# Patient Record
Sex: Male | Born: 1950 | Race: White | Hispanic: No | Marital: Married | Smoking: Former smoker
Health system: Southern US, Community
[De-identification: ages and names within clinical notes are randomized; demographics above are authoritative.]

## PROBLEM LIST (undated history)

## (undated) DIAGNOSIS — E785 Hyperlipidemia, unspecified: Secondary | ICD-10-CM

## (undated) DIAGNOSIS — G4730 Sleep apnea, unspecified: Secondary | ICD-10-CM

## (undated) DIAGNOSIS — Z973 Presence of spectacles and contact lenses: Secondary | ICD-10-CM

## (undated) DIAGNOSIS — N183 Chronic kidney disease, stage 3 unspecified: Secondary | ICD-10-CM

## (undated) DIAGNOSIS — I10 Essential (primary) hypertension: Secondary | ICD-10-CM

## (undated) DIAGNOSIS — M1990 Unspecified osteoarthritis, unspecified site: Secondary | ICD-10-CM

## (undated) DIAGNOSIS — C801 Malignant (primary) neoplasm, unspecified: Secondary | ICD-10-CM

## (undated) HISTORY — DX: Hyperlipidemia, unspecified: E78.5

## (undated) HISTORY — DX: Essential (primary) hypertension: I10

## (undated) HISTORY — PX: JOINT REPLACEMENT: SHX530

## (undated) HISTORY — PX: COLONOSCOPY: SHX174

## (undated) HISTORY — PX: APPENDECTOMY: SHX54

---

## 1983-04-18 HISTORY — PX: KNEE ARTHROSCOPY: SUR90

## 1996-04-17 HISTORY — PX: KNEE ARTHROSCOPY: SUR90

## 2005-04-17 HISTORY — PX: SHOULDER SURGERY: SHX246

## 2009-02-16 ENCOUNTER — Ambulatory Visit (HOSPITAL_BASED_OUTPATIENT_CLINIC_OR_DEPARTMENT_OTHER): Admission: RE | Admit: 2009-02-16 | Discharge: 2009-02-17 | Payer: Self-pay | Admitting: Orthopedic Surgery

## 2015-04-20 DIAGNOSIS — H5203 Hypermetropia, bilateral: Secondary | ICD-10-CM | POA: Diagnosis not present

## 2015-04-20 DIAGNOSIS — H43813 Vitreous degeneration, bilateral: Secondary | ICD-10-CM | POA: Diagnosis not present

## 2015-04-20 DIAGNOSIS — H40003 Preglaucoma, unspecified, bilateral: Secondary | ICD-10-CM | POA: Diagnosis not present

## 2015-04-20 DIAGNOSIS — H524 Presbyopia: Secondary | ICD-10-CM | POA: Diagnosis not present

## 2015-04-20 DIAGNOSIS — H43313 Vitreous membranes and strands, bilateral: Secondary | ICD-10-CM | POA: Diagnosis not present

## 2015-04-20 DIAGNOSIS — I10 Essential (primary) hypertension: Secondary | ICD-10-CM | POA: Diagnosis not present

## 2015-04-20 DIAGNOSIS — H52223 Regular astigmatism, bilateral: Secondary | ICD-10-CM | POA: Diagnosis not present

## 2015-05-04 DIAGNOSIS — M159 Polyosteoarthritis, unspecified: Secondary | ICD-10-CM | POA: Diagnosis not present

## 2015-05-04 DIAGNOSIS — Z1389 Encounter for screening for other disorder: Secondary | ICD-10-CM | POA: Diagnosis not present

## 2015-05-04 DIAGNOSIS — I10 Essential (primary) hypertension: Secondary | ICD-10-CM | POA: Diagnosis not present

## 2015-05-04 DIAGNOSIS — Z6839 Body mass index (BMI) 39.0-39.9, adult: Secondary | ICD-10-CM | POA: Diagnosis not present

## 2015-05-19 DIAGNOSIS — Z79899 Other long term (current) drug therapy: Secondary | ICD-10-CM | POA: Diagnosis not present

## 2015-05-19 DIAGNOSIS — Z6841 Body Mass Index (BMI) 40.0 and over, adult: Secondary | ICD-10-CM | POA: Diagnosis not present

## 2015-05-19 DIAGNOSIS — I10 Essential (primary) hypertension: Secondary | ICD-10-CM | POA: Diagnosis not present

## 2015-05-20 DIAGNOSIS — M5136 Other intervertebral disc degeneration, lumbar region: Secondary | ICD-10-CM | POA: Diagnosis not present

## 2015-05-20 DIAGNOSIS — M9903 Segmental and somatic dysfunction of lumbar region: Secondary | ICD-10-CM | POA: Diagnosis not present

## 2015-05-20 DIAGNOSIS — M7552 Bursitis of left shoulder: Secondary | ICD-10-CM | POA: Diagnosis not present

## 2015-05-20 DIAGNOSIS — M9902 Segmental and somatic dysfunction of thoracic region: Secondary | ICD-10-CM | POA: Diagnosis not present

## 2015-05-20 DIAGNOSIS — M9906 Segmental and somatic dysfunction of lower extremity: Secondary | ICD-10-CM | POA: Diagnosis not present

## 2015-05-24 DIAGNOSIS — M9903 Segmental and somatic dysfunction of lumbar region: Secondary | ICD-10-CM | POA: Diagnosis not present

## 2015-05-24 DIAGNOSIS — M9906 Segmental and somatic dysfunction of lower extremity: Secondary | ICD-10-CM | POA: Diagnosis not present

## 2015-05-24 DIAGNOSIS — M7552 Bursitis of left shoulder: Secondary | ICD-10-CM | POA: Diagnosis not present

## 2015-05-24 DIAGNOSIS — M5136 Other intervertebral disc degeneration, lumbar region: Secondary | ICD-10-CM | POA: Diagnosis not present

## 2015-05-24 DIAGNOSIS — M9902 Segmental and somatic dysfunction of thoracic region: Secondary | ICD-10-CM | POA: Diagnosis not present

## 2015-06-04 DIAGNOSIS — G4733 Obstructive sleep apnea (adult) (pediatric): Secondary | ICD-10-CM | POA: Diagnosis not present

## 2015-06-24 DIAGNOSIS — I10 Essential (primary) hypertension: Secondary | ICD-10-CM | POA: Diagnosis not present

## 2015-06-24 DIAGNOSIS — Z79899 Other long term (current) drug therapy: Secondary | ICD-10-CM | POA: Diagnosis not present

## 2015-06-24 DIAGNOSIS — Z6841 Body Mass Index (BMI) 40.0 and over, adult: Secondary | ICD-10-CM | POA: Diagnosis not present

## 2015-06-25 DIAGNOSIS — Z79899 Other long term (current) drug therapy: Secondary | ICD-10-CM | POA: Diagnosis not present

## 2015-07-02 DIAGNOSIS — G4733 Obstructive sleep apnea (adult) (pediatric): Secondary | ICD-10-CM | POA: Diagnosis not present

## 2015-07-21 DIAGNOSIS — G4733 Obstructive sleep apnea (adult) (pediatric): Secondary | ICD-10-CM | POA: Diagnosis not present

## 2015-07-21 DIAGNOSIS — Z1389 Encounter for screening for other disorder: Secondary | ICD-10-CM | POA: Diagnosis not present

## 2015-07-21 DIAGNOSIS — Z6841 Body Mass Index (BMI) 40.0 and over, adult: Secondary | ICD-10-CM | POA: Diagnosis not present

## 2015-07-21 DIAGNOSIS — J0110 Acute frontal sinusitis, unspecified: Secondary | ICD-10-CM | POA: Diagnosis not present

## 2015-08-02 DIAGNOSIS — G4733 Obstructive sleep apnea (adult) (pediatric): Secondary | ICD-10-CM | POA: Diagnosis not present

## 2015-09-01 DIAGNOSIS — G4733 Obstructive sleep apnea (adult) (pediatric): Secondary | ICD-10-CM | POA: Diagnosis not present

## 2015-09-07 DIAGNOSIS — G4733 Obstructive sleep apnea (adult) (pediatric): Secondary | ICD-10-CM | POA: Diagnosis not present

## 2015-10-02 DIAGNOSIS — G4733 Obstructive sleep apnea (adult) (pediatric): Secondary | ICD-10-CM | POA: Diagnosis not present

## 2015-11-01 DIAGNOSIS — G4733 Obstructive sleep apnea (adult) (pediatric): Secondary | ICD-10-CM | POA: Diagnosis not present

## 2015-12-02 DIAGNOSIS — G4733 Obstructive sleep apnea (adult) (pediatric): Secondary | ICD-10-CM | POA: Diagnosis not present

## 2015-12-14 DIAGNOSIS — G4733 Obstructive sleep apnea (adult) (pediatric): Secondary | ICD-10-CM | POA: Diagnosis not present

## 2015-12-29 DIAGNOSIS — N183 Chronic kidney disease, stage 3 (moderate): Secondary | ICD-10-CM | POA: Diagnosis not present

## 2015-12-29 DIAGNOSIS — I10 Essential (primary) hypertension: Secondary | ICD-10-CM | POA: Diagnosis not present

## 2015-12-29 DIAGNOSIS — Z6841 Body Mass Index (BMI) 40.0 and over, adult: Secondary | ICD-10-CM | POA: Diagnosis not present

## 2015-12-29 DIAGNOSIS — Z9181 History of falling: Secondary | ICD-10-CM | POA: Diagnosis not present

## 2016-01-02 DIAGNOSIS — G4733 Obstructive sleep apnea (adult) (pediatric): Secondary | ICD-10-CM | POA: Diagnosis not present

## 2016-01-17 DIAGNOSIS — M25561 Pain in right knee: Secondary | ICD-10-CM | POA: Diagnosis not present

## 2016-01-17 DIAGNOSIS — M25562 Pain in left knee: Secondary | ICD-10-CM | POA: Diagnosis not present

## 2016-01-17 DIAGNOSIS — G4730 Sleep apnea, unspecified: Secondary | ICD-10-CM | POA: Diagnosis not present

## 2016-01-17 DIAGNOSIS — I10 Essential (primary) hypertension: Secondary | ICD-10-CM | POA: Diagnosis not present

## 2016-01-17 DIAGNOSIS — E785 Hyperlipidemia, unspecified: Secondary | ICD-10-CM | POA: Diagnosis not present

## 2016-02-01 DIAGNOSIS — G4733 Obstructive sleep apnea (adult) (pediatric): Secondary | ICD-10-CM | POA: Diagnosis not present

## 2016-02-08 ENCOUNTER — Encounter (INDEPENDENT_AMBULATORY_CARE_PROVIDER_SITE_OTHER): Payer: Self-pay | Admitting: Orthopaedic Surgery

## 2016-02-08 ENCOUNTER — Ambulatory Visit (INDEPENDENT_AMBULATORY_CARE_PROVIDER_SITE_OTHER): Payer: PPO

## 2016-02-08 ENCOUNTER — Ambulatory Visit (INDEPENDENT_AMBULATORY_CARE_PROVIDER_SITE_OTHER): Payer: PPO | Admitting: Orthopaedic Surgery

## 2016-02-08 ENCOUNTER — Ambulatory Visit (INDEPENDENT_AMBULATORY_CARE_PROVIDER_SITE_OTHER): Payer: Self-pay

## 2016-02-08 VITALS — BP 100/72 | HR 66 | Ht 69.0 in | Wt 252.0 lb

## 2016-02-08 DIAGNOSIS — M1711 Unilateral primary osteoarthritis, right knee: Secondary | ICD-10-CM

## 2016-02-08 DIAGNOSIS — M25562 Pain in left knee: Secondary | ICD-10-CM | POA: Diagnosis not present

## 2016-02-08 DIAGNOSIS — M25561 Pain in right knee: Secondary | ICD-10-CM

## 2016-02-08 DIAGNOSIS — M1712 Unilateral primary osteoarthritis, left knee: Secondary | ICD-10-CM | POA: Diagnosis not present

## 2016-02-08 DIAGNOSIS — M170 Bilateral primary osteoarthritis of knee: Secondary | ICD-10-CM | POA: Insufficient documentation

## 2016-02-08 MED ORDER — METHYLPREDNISOLONE ACETATE 40 MG/ML IJ SUSP
40.0000 mg | INTRAMUSCULAR | Status: AC | PRN
  Administered 2016-02-08: 40 mg via INTRA_ARTICULAR

## 2016-02-08 MED ORDER — BUPIVACAINE HCL 0.25 % IJ SOLN
0.6600 mL | INTRAMUSCULAR | Status: AC | PRN
  Administered 2016-02-08: .66 mL via INTRA_ARTICULAR

## 2016-02-08 MED ORDER — LIDOCAINE HCL 1 % IJ SOLN
1.0000 mL | INTRAMUSCULAR | Status: AC | PRN
  Administered 2016-02-08: 1 mL

## 2016-02-08 NOTE — Progress Notes (Signed)
Office Visit Note   Patient: Jesse Ali           Date of Birth: [birth year]           MRN: [id number] Visit Date: 02/08/2016              Requested by: No referring provider defined for this encounter. PCP: Ronita Hipps, MD   Assessment & Plan: Visit Diagnoses:  1. Bilateral primary osteoarthritis of knee   2. Left knee pain, unspecified chronicity   3. Right knee pain, unspecified chronicity     Plan: Bilateral knee injections performed. He will increase the Aleve to 2 by mouth twice a day.  Follow-Up Instructions: We discussed the problems with the Aleve. We discussed activity modification. Return 1 month for follow-up  Orders:  Orders Placed This Encounter  Procedures  . Large Joint Injection/Arthrocentesis  . Large Joint Injection/Arthrocentesis  . XR Knee 1-2 Views Left  . XR Knee 1-2 Views Right   Meds ordered this encounter  Medications  . atorvastatin (LIPITOR) 10 MG tablet  . losartan-hydrochlorothiazide (HYZAAR) 100-25 MG tablet  . aspirin 81 MG chewable tablet    Sig: Chew by mouth daily.  . naproxen sodium (ANAPROX) 220 MG tablet    Sig: Take 220 mg by mouth 2 (two) times daily. 2 aleve tablets daily      Procedures: Large Joint Inj Date/Time: 02/08/2016 11:01 AM Performed by: Marybelle Killings Authorized by: Rodell Perna C   Consent Given by:  Patient Indications:  Pain and joint swelling Location:  Knee Site:  R knee Needle Size:  22 G Needle Length:  1.5 inches Approach:  Anterolateral Ultrasound Guidance: No   Fluoroscopic Guidance: No  no Medications:  1 mL lidocaine 1 %; 40 mg methylPREDNISolone acetate 40 MG/ML; 0.66 mL bupivacaine 0.25 % Patient tolerance:  Patient tolerated the procedure well with no immediate complications Large Joint Inj Date/Time: 02/08/2016 11:01 AM Performed by: Marybelle Killings Authorized by: Rodell Perna C   Consent Given by:  Patient Indications:  Pain and joint swelling Location:  Knee Site:  L knee Needle  Size:  22 G Needle Length:  1.5 inches Approach:  Anterolateral Ultrasound Guidance: No   Fluoroscopic Guidance: No  no Medications:  1 mL lidocaine 1 %; 40 mg methylPREDNISolone acetate 40 MG/ML; 0.66 mL bupivacaine 0.25 % Patient tolerance:  Patient tolerated the procedure well with no immediate complications     Clinical Data: No additional findings.   Subjective: Chief Complaint  Patient presents with  . Left Knee - Pain, Weakness, Edema  . Right Knee - Pain, Weakness    Patient presents with chronic bilateral knee pain, left greater than right. Left knee pain more medial and across knee. Patient complains of pain, swelling and weakness. History of left knee scope in [place] in [place]. Patient has had prior injury to left knee when he was approximately 65 years old.  Right knee pain as well, mostly medial to straight across knee. Injured right knee in a work place accident in 1989. Was carrying a heavy load and twisted knee. History of right knee scope in 1998.    Weakness  Associated symptoms include weakness.    Review of Systems  Neurological: Positive for weakness.     Objective: Vital Signs: BP 100/72 (BP Location: Right Arm)   Pulse 66   Ht 5\' 9"  (1.753 m)   Wt 252 lb (114.3 kg)   BMI 37.21 kg/m   Physical Exam  Constitutional: He is oriented to person, place, and time. He appears well-developed and well-nourished.  HENT:  Head: Normocephalic and atraumatic.  Eyes: EOM are normal. Pupils are equal, round, and reactive to light.  Neck: Normal range of motion. No tracheal deviation present. No thyromegaly present.  Cardiovascular: Normal rate, regular rhythm and normal heart sounds.   Pulmonary/Chest: Effort normal. He has wheezes.  Abdominal: Soft. Bowel sounds are normal.  Neurological: He is alert and oriented to person, place, and time. He has normal reflexes.  Skin: Skin is warm and dry. Capillary refill takes less than 2 seconds.  Psychiatric: He has a  normal mood and affect. His behavior is normal. Judgment and thought content normal.    Ortho Exam patient is 2+ bilateral knee effusion. Normal hip range of motion 40 internal and external rotation symmetrical. No pitting edema dorsalis pedis posterior tibial pulses are normal. Upper summary the range of motion is good. Normal cervical flexion and normal extension no brachial plexus tenderness elbows reach full extension no synovitis of the hands or fingers. No rash or exposed skin.  Knee exam demonstrates stable collateral ligaments crucial ligament exam is normal he lacks the 5 reaching full extension. Gait shows he walks with a stiff knee gait with limited flexion. He has the varus deformity less than 10 of the left knee. Right knee shows less than 5 varus. Bilateral medial joint line tenderness palpable osteophytes lateral compartment is tender patellofemoral crepitus. Quad strength is good.  Specialty Comments:  No specialty comments available.  Imaging: Xr Knee 1-2 Views Left  Result Date: 02/08/2016 Two-view x-rays left knee obtained. Tricompartmental degenerative changes medial marginal osteophytes joint space narrowing bone-on-bone in the medial compartment. Lateral compartment shows marginal osteophytes and patellofemoral degenerative changes. Anterior loose bodies noted on lateral x-ray  Xr Knee 1-2 Views Right  Result Date: 02/08/2016 Standing x-rays right knee AP and lateral demonstrates tricompartmental degenerative changes osteoarthritis. Medial bone-on-bone changes lateral marginal osteophytes patellofemoral degenerative changes. Mild varus deformity. Impression moderate to severe knee osteoarthritis tricompartmental    PMFS History: There are no active problems to display for this patient.  Past Medical History:  Diagnosis Date  . Hyperlipidemia   . Hypertension     History reviewed. No pertinent family history.  Past Surgical History:  Procedure Laterality Date   . KNEE ARTHROSCOPY Right 1998  . KNEE ARTHROSCOPY Left 1985  . SHOULDER SURGERY Right 2007   removal bone spurs   Social History   Occupational History  . retired    Social History Main Topics  . Smoking status: Former Research scientist (life sciences)  . Smokeless tobacco: Never Used     Comment: quit smoking 33 years ago  . Alcohol use No  . Drug use: No  . Sexual activity: Yes    Partners: Female

## 2016-02-08 NOTE — Patient Instructions (Signed)
Take Aleve 2 by mouth twice a day with food. Use ice as needed to your knees .

## 2016-03-03 DIAGNOSIS — G4733 Obstructive sleep apnea (adult) (pediatric): Secondary | ICD-10-CM | POA: Diagnosis not present

## 2016-03-14 ENCOUNTER — Encounter (INDEPENDENT_AMBULATORY_CARE_PROVIDER_SITE_OTHER): Payer: Self-pay | Admitting: Orthopaedic Surgery

## 2016-03-14 ENCOUNTER — Ambulatory Visit (INDEPENDENT_AMBULATORY_CARE_PROVIDER_SITE_OTHER): Payer: PPO | Admitting: Orthopaedic Surgery

## 2016-03-14 VITALS — BP 130/79 | HR 65 | Ht 69.0 in | Wt 248.0 lb

## 2016-03-14 DIAGNOSIS — M170 Bilateral primary osteoarthritis of knee: Secondary | ICD-10-CM

## 2016-03-14 NOTE — Progress Notes (Signed)
Office Visit Note   Patient: Jesse Ali           Date of Birth: [birth year]           MRN: [id number] Visit Date: 03/14/2016              Requested by: Ronita Hipps, MD [address] [place] [address], PCP: Ronita Hipps, MD   Assessment & Plan: Visit Diagnoses:  1. Bilateral primary osteoarthritis of knee     Plan: Patient has been on home therapy program is had intra-articular injections and has tricompartmental degenerative arthritis with progressive varus deformity of both knees worse on the left than right. Patient will not proceed with the total knee arthroplasty starting with the left total knee. Planned procedure discussed was that this up for spinal anesthesia. Home health physical therapy be arranged and then outpatient therapy in [place] . Follow-Up Instructions: No Follow-up on file.   Orders:  No orders of the defined types were placed in this encounter.  No orders of the defined types were placed in this encounter.     Procedures: No procedures performed   Clinical Data: No additional findings.   Subjective: Chief Complaint  Patient presents with  . Left Knee - Follow-up  . Right Knee - Follow-up    Patient returns for one month follow up bilateral knee pain. He had bilateral knee injections on 02/08/2016. He states that the injections only helped for about a day. The pain seems to be worse now. Left knee pain greater than right.    Review of Systems  Constitutional: Negative for chills and diaphoresis.  HENT: Negative for ear discharge, ear pain and nosebleeds.   Eyes: Negative for discharge and visual disturbance.  Respiratory: Negative for cough, choking and shortness of breath.   Cardiovascular: Negative for chest pain and palpitations.       Positive for hypertension  Gastrointestinal: Negative for abdominal distention and abdominal pain.  Endocrine: Negative for cold intolerance and heat intolerance.  Genitourinary: Negative for  flank pain and hematuria.  Skin: Negative for rash and wound.  Neurological: Negative for seizures and speech difficulty.  Hematological: Negative for adenopathy. Does not bruise/bleed easily.  Psychiatric/Behavioral: Negative for agitation and suicidal ideas.     Objective: Vital Signs: BP 130/79   Pulse 65   Ht 5\' 9"  (1.753 m)   Wt 248 lb (112.5 kg)   BMI 36.62 kg/m   Physical Exam  Constitutional: He is oriented to person, place, and time. He appears well-developed and well-nourished.  HENT:  Head: Normocephalic and atraumatic.  Eyes: EOM are normal. Pupils are equal, round, and reactive to light.  Neck: No tracheal deviation present. No thyromegaly present.  Cardiovascular: Normal rate and regular rhythm.   Pulmonary/Chest: Effort normal. He has no wheezes.  Abdominal: Soft. Bowel sounds are normal.  Musculoskeletal:  Normal lower extremity reflexes. No pain with hip range of motion. Standing position bilateral knee varus. Intact distal pulses. Reflexes are 2+.  Neurological: He is alert and oriented to person, place, and time.  Skin: Skin is warm and dry. Capillary refill takes less than 2 seconds.  Psychiatric: He has a normal mood and affect. His behavior is normal. Judgment and thought content normal.    Ortho Exam left knee has a 5 to 120 knee range of motion. Palpable medial osteophytes 2+ synovitis crepitus with knee range of motion. Distal pulses are intact. Opposite right knee has mild varus full extension and flexion to 120.  Specialty Comments:  No specialty comments available.  Imaging: No results found.  Standing x-rays show osteoarthritis is deformity medial compartment 1 mm space both right and left knee. Marginal osteophytes laterally as well as patellofemoral. Subchondral sclerosis and subchondral cyst formation in the medial compartment PMFS History: Patient Active Problem List   Diagnosis Date Noted  . Bilateral primary osteoarthritis of knee  02/08/2016   Past Medical History:  Diagnosis Date  . Hyperlipidemia   . Hypertension     History reviewed. No pertinent family history.  Past Surgical History:  Procedure Laterality Date  . KNEE ARTHROSCOPY Right 1998  . KNEE ARTHROSCOPY Left 1985  . SHOULDER SURGERY Right 2007   removal bone spurs   Social History   Occupational History  . retired    Social History Main Topics  . Smoking status: Former Research scientist (life sciences)  . Smokeless tobacco: Never Used     Comment: quit smoking 33 years ago  . Alcohol use No  . Drug use: No  . Sexual activity: Yes    Partners: Female

## 2016-03-21 ENCOUNTER — Encounter (HOSPITAL_COMMUNITY)
Admission: RE | Admit: 2016-03-21 | Discharge: 2016-03-21 | Disposition: A | Discharge status: Home / Self Care | Payer: PPO | Source: Ambulatory Visit | Attending: Orthopaedic Surgery | Admitting: Orthopaedic Surgery

## 2016-03-21 ENCOUNTER — Ambulatory Visit (HOSPITAL_COMMUNITY)
Admission: RE | Admit: 2016-03-21 | Discharge: 2016-03-21 | Disposition: A | Discharge status: Home / Self Care | Payer: PPO | Source: Ambulatory Visit | Attending: Surgery | Admitting: Surgery

## 2016-03-21 ENCOUNTER — Encounter (HOSPITAL_COMMUNITY): Payer: Self-pay

## 2016-03-21 DIAGNOSIS — Z01812 Encounter for preprocedural laboratory examination: Secondary | ICD-10-CM | POA: Diagnosis not present

## 2016-03-21 DIAGNOSIS — Z01818 Encounter for other preprocedural examination: Secondary | ICD-10-CM | POA: Diagnosis not present

## 2016-03-21 DIAGNOSIS — R001 Bradycardia, unspecified: Secondary | ICD-10-CM | POA: Diagnosis not present

## 2016-03-21 DIAGNOSIS — G4733 Obstructive sleep apnea (adult) (pediatric): Secondary | ICD-10-CM | POA: Diagnosis not present

## 2016-03-21 DIAGNOSIS — Z01810 Encounter for preprocedural cardiovascular examination: Secondary | ICD-10-CM | POA: Insufficient documentation

## 2016-03-21 DIAGNOSIS — D71 Functional disorders of polymorphonuclear neutrophils: Secondary | ICD-10-CM | POA: Diagnosis not present

## 2016-03-21 DIAGNOSIS — I10 Essential (primary) hypertension: Secondary | ICD-10-CM | POA: Insufficient documentation

## 2016-03-21 HISTORY — DX: Chronic kidney disease, stage 3 (moderate): N18.3

## 2016-03-21 HISTORY — DX: Presence of spectacles and contact lenses: Z97.3

## 2016-03-21 HISTORY — DX: Sleep apnea, unspecified: G47.30

## 2016-03-21 HISTORY — DX: Unspecified osteoarthritis, unspecified site: M19.90

## 2016-03-21 HISTORY — DX: Chronic kidney disease, stage 3 unspecified: N18.30

## 2016-03-21 LAB — COMPREHENSIVE METABOLIC PANEL
ALBUMIN: 3.9 g/dL (ref 3.5–5.0)
ALK PHOS: 61 U/L (ref 38–126)
ALT: 24 U/L (ref 17–63)
AST: 22 U/L (ref 15–41)
Anion gap: 6 (ref 5–15)
BUN: 25 mg/dL — AB (ref 6–20)
CALCIUM: 9.5 mg/dL (ref 8.9–10.3)
CO2: 28 mmol/L (ref 22–32)
CREATININE: 1.6 mg/dL — AB (ref 0.61–1.24)
Chloride: 103 mmol/L (ref 101–111)
GFR calc Af Amer: 50 mL/min — ABNORMAL LOW (ref >60)
GFR calc non Af Amer: 43 mL/min — ABNORMAL LOW (ref >60)
GLUCOSE: 98 mg/dL (ref 65–99)
Potassium: 3.9 mmol/L (ref 3.5–5.1)
SODIUM: 137 mmol/L (ref 135–145)
Total Bilirubin: 0.7 mg/dL (ref 0.3–1.2)
Total Protein: 7.3 g/dL (ref 6.5–8.1)

## 2016-03-21 LAB — URINALYSIS, ROUTINE W REFLEX MICROSCOPIC
Bilirubin Urine: NEGATIVE
Glucose, UA: NEGATIVE mg/dL
Hgb urine dipstick: NEGATIVE
Ketones, ur: NEGATIVE mg/dL
Leukocytes, UA: NEGATIVE
Nitrite: NEGATIVE
Protein, ur: NEGATIVE mg/dL
Specific Gravity, Urine: 1.015 (ref 1.005–1.030)
pH: 6 (ref 5.0–8.0)

## 2016-03-21 LAB — SURGICAL PCR SCREEN
MRSA, PCR: NEGATIVE
Staphylococcus aureus: NEGATIVE

## 2016-03-21 LAB — CBC
HCT: 41.5 % (ref 39.0–52.0)
Hemoglobin: 14.2 g/dL (ref 13.0–17.0)
MCH: 31.8 pg (ref 26.0–34.0)
MCHC: 34.2 g/dL (ref 30.0–36.0)
MCV: 92.8 fL (ref 78.0–100.0)
Platelets: 216 K/uL (ref 150–400)
RBC: 4.47 MIL/uL (ref 4.22–5.81)
RDW: 13.2 % (ref 11.5–15.5)
WBC: 9.4 K/uL (ref 4.0–10.5)

## 2016-03-21 NOTE — Pre-Procedure Instructions (Signed)
[facility]  03/21/2016      [place], [place] [address] Phone: [phone] Fax: [phone]    Your procedure is scheduled on Wednesday, December 13.  Report to [facility] Admitting at 10:30 A.M.               Your surgery or procedure is scheduled for 12:30 PM   Call this number if you have problems the morning of surgery: [phone]                For any other questions, please call [phone], Monday - Friday 8 AM - 4 PM.   Remember:  Do not eat food or drink liquids after midnight Tuesday, December 12.  Take these medicines the morning of surgery with A SIP OF WATER: None                               1 Week prior to surgery STOP taking Aspirin,  Aspirin Products (Goody Powder, Excedrin Migraine), Ibuprofen (Advil), Naproxen (Aleve), Vitamins and Herbal Products (ie Fish Oil)                     [facility]- Preparing For Surgery  Before surgery, you can play an important role. Because skin is not sterile, your skin needs to be as free of germs as possible. You can reduce the number of germs on your skin by washing with CHG (chlorahexidine gluconate) Soap before surgery.  CHG is an antiseptic cleaner which kills germs and bonds with the skin to continue killing germs even after washing.  Please do not use if you have an allergy to CHG or antibacterial soaps. If your skin becomes reddened/irritated stop using the CHG.  Do not shave (including legs and underarms) for at least 48 hours prior to first CHG shower. It is OK to shave your face.  Please follow these instructions carefully.   1. Shower the NIGHT BEFORE SURGERY and the MORNING OF SURGERY with CHG.   2. If you chose to wash your hair, wash your hair first as usual with your normal shampoo.  3. After you shampoo, rinse your hair and body thoroughly to remove the shampoo.  4. Use CHG as you would any other liquid soap. You  can apply CHG directly to the skin and wash gently with a scrungie or a clean washcloth.   5. Apply the CHG Soap to your body ONLY FROM THE NECK DOWN.  Do not use on open wounds or open sores. Avoid contact with your eyes, ears, mouth and genitals (private parts). Wash genitals (private parts) with your normal soap.  6. Wash thoroughly, paying special attention to the area where your surgery will be performed.  7. Thoroughly rinse your body with warm water from the neck down.  8. DO NOT shower/wash with your normal soap after using and rinsing off the CHG Soap.  9. Pat yourself dry with a CLEAN TOWEL.   10. Wear CLEAN PAJAMAS   11. Place CLEAN SHEETS on your bed the night of your first shower and DO NOT SLEEP WITH PETS.  Day of Surgery: Do not apply any deodorants/lotions. Please wear clean clothes to the hospital/surgery center.     Do not wear jewelry, make-up or nail polish.  Do not wear lotions, powders, or  perfumes, or deodorant.  Men may shave face and neck.  Do not bring valuables to the hospital.  [facility] is not responsible for any belongings or valuables.  Contacts, dentures or bridgework may not be worn into surgery.  Leave your suitcase in the car.  After surgery it may be brought to your room.  For patients admitted to the hospital, discharge time will be determined by your treatment team.  Special instructions:    Please read over the following fact sheets that you were given:  Patient Instructions for Mupirocin Application, Coughing and Deep Breathing Pain Booklet

## 2016-03-21 NOTE — Progress Notes (Signed)
PCP - Dr. Kennith Maes Cardiologist - denies  EKG - 03/21/16 CXR - 03/21/16  Echo/stress test/cardiac cath - denies  Patient denies chest pain and shortness of breath at PAT appointment.

## 2016-03-22 NOTE — Progress Notes (Addendum)
Anesthesia Chart Review:  Pt is a 65 year old male scheduled for L total knee arthroplasty on 03/29/2016 with Rodell Perna, MD.   - PCP is Kennith Maes, MD in [place].   PMH includes:  HTN, hyperlipidemia, CKD (stage 3), OSA. Former smoker. BMI 38  Medications include: ASA, lipitor, losartan-hctz.   Preoperative labs reviewed.  Cr 1.6, BUN 25. I spoke with Dr. Helene Kelp on 03/24/16.  Prior Cr was 1.12 and BUN was 17 on 01/05/16.  She believes he is still ok for surgery but will attempt to get pt into her office to recheck labs, tune-up meds prior to surgery.   CXR 03/21/16: Prior granulomatous disease. No acute abnormality seen.  EKG 03/21/16: Sinus bradycardia (58 bpm) with sinus arrhythmia. Minimal voltage criteria for LVH, may be normal variant.   Willeen Cass, FNP-BC [facility]/Anesthesiology Phone: [phone] 03/24/2016 4:06 PM  Addendum:  Pt saw Dr. Helene Kelp 03/27/16 and recheck Cr was 1.4. HCTZ was stopped. Dr. Helene Kelp feels pt can proceed with surgery.  If no changes, I anticipate pt can proceed with surgery as scheduled.   Willeen Cass, FNP-BC [facility] Short Stay Surgical Center/Anesthesiology Phone: [phone] 03/27/2016 4:26 PM

## 2016-03-23 ENCOUNTER — Telehealth (INDEPENDENT_AMBULATORY_CARE_PROVIDER_SITE_OTHER): Payer: Self-pay | Admitting: Orthopedic Surgery

## 2016-03-23 NOTE — Telephone Encounter (Signed)
I do not think he will need any blood. If any was actually needed it would be a day or 2 after surgery and likely only one unit.  80 % chance do not need blood. thanks

## 2016-03-23 NOTE — Telephone Encounter (Signed)
Linda from the blood bank called and left a message regarding Mr. Dey. He has a left TKA scheduled for 03/29/2016.  She states on the message that patient had a + antibody screen. They would normally cross match 2 units, do you feel that more blood is needed?

## 2016-03-24 ENCOUNTER — Encounter (HOSPITAL_COMMUNITY): Payer: Self-pay

## 2016-03-24 NOTE — Telephone Encounter (Signed)
I called and advised Blood Bank. They are going to cross match the usual 2 units.

## 2016-03-27 DIAGNOSIS — I10 Essential (primary) hypertension: Secondary | ICD-10-CM | POA: Diagnosis not present

## 2016-03-28 MED ORDER — DEXTROSE 5 % IV SOLN
3.0000 g | INTRAVENOUS | Status: AC
  Administered 2016-03-29: 3 g via INTRAVENOUS
  Filled 2016-03-28: qty 3000

## 2016-03-29 ENCOUNTER — Inpatient Hospital Stay (HOSPITAL_COMMUNITY): Payer: PPO | Admitting: Emergency Medicine

## 2016-03-29 ENCOUNTER — Encounter (HOSPITAL_COMMUNITY): Payer: Self-pay | Admitting: Surgery

## 2016-03-29 ENCOUNTER — Inpatient Hospital Stay (HOSPITAL_COMMUNITY)
Admission: RE | Admit: 2016-03-29 | Discharge: 2016-03-31 | DRG: 470 | Disposition: A | Discharge status: Home / Self Care | Payer: PPO | Source: Ambulatory Visit | Attending: Orthopaedic Surgery | Admitting: Orthopaedic Surgery

## 2016-03-29 ENCOUNTER — Inpatient Hospital Stay (HOSPITAL_COMMUNITY): Payer: PPO | Admitting: Anesthesiology

## 2016-03-29 ENCOUNTER — Inpatient Hospital Stay (HOSPITAL_COMMUNITY): Payer: PPO

## 2016-03-29 ENCOUNTER — Encounter (HOSPITAL_COMMUNITY)
Admission: RE | Disposition: A | Discharge status: Home / Self Care | Payer: Self-pay | Source: Ambulatory Visit | Attending: Orthopaedic Surgery

## 2016-03-29 DIAGNOSIS — Z87891 Personal history of nicotine dependence: Secondary | ICD-10-CM

## 2016-03-29 DIAGNOSIS — I129 Hypertensive chronic kidney disease with stage 1 through stage 4 chronic kidney disease, or unspecified chronic kidney disease: Secondary | ICD-10-CM | POA: Diagnosis not present

## 2016-03-29 DIAGNOSIS — Z96652 Presence of left artificial knee joint: Secondary | ICD-10-CM | POA: Diagnosis not present

## 2016-03-29 DIAGNOSIS — Z7982 Long term (current) use of aspirin: Secondary | ICD-10-CM | POA: Diagnosis not present

## 2016-03-29 DIAGNOSIS — E785 Hyperlipidemia, unspecified: Secondary | ICD-10-CM | POA: Diagnosis not present

## 2016-03-29 DIAGNOSIS — G4730 Sleep apnea, unspecified: Secondary | ICD-10-CM | POA: Diagnosis present

## 2016-03-29 DIAGNOSIS — Z09 Encounter for follow-up examination after completed treatment for conditions other than malignant neoplasm: Secondary | ICD-10-CM

## 2016-03-29 DIAGNOSIS — M25662 Stiffness of left knee, not elsewhere classified: Secondary | ICD-10-CM

## 2016-03-29 DIAGNOSIS — Z471 Aftercare following joint replacement surgery: Secondary | ICD-10-CM | POA: Diagnosis not present

## 2016-03-29 DIAGNOSIS — N183 Chronic kidney disease, stage 3 (moderate): Secondary | ICD-10-CM | POA: Diagnosis not present

## 2016-03-29 DIAGNOSIS — Z79899 Other long term (current) drug therapy: Secondary | ICD-10-CM

## 2016-03-29 DIAGNOSIS — R262 Difficulty in walking, not elsewhere classified: Secondary | ICD-10-CM

## 2016-03-29 DIAGNOSIS — M170 Bilateral primary osteoarthritis of knee: Principal | ICD-10-CM | POA: Diagnosis present

## 2016-03-29 DIAGNOSIS — M19049 Primary osteoarthritis, unspecified hand: Secondary | ICD-10-CM | POA: Diagnosis present

## 2016-03-29 DIAGNOSIS — M1712 Unilateral primary osteoarthritis, left knee: Secondary | ICD-10-CM | POA: Diagnosis not present

## 2016-03-29 DIAGNOSIS — G8918 Other acute postprocedural pain: Secondary | ICD-10-CM | POA: Diagnosis not present

## 2016-03-29 HISTORY — PX: TOTAL KNEE ARTHROPLASTY: SHX125

## 2016-03-29 LAB — APTT: APTT: 28 s (ref 24–36)

## 2016-03-29 LAB — PROTIME-INR
INR: 1.07
Prothrombin Time: 13.9 seconds (ref 11.4–15.2)

## 2016-03-29 LAB — TYPE AND SCREEN
ABO/RH(D): A NEG
ANTIBODY SCREEN: POSITIVE

## 2016-03-29 SURGERY — ARTHROPLASTY, KNEE, TOTAL
Anesthesia: Spinal | Site: Knee | Laterality: Left

## 2016-03-29 MED ORDER — SODIUM CHLORIDE 0.9 % IR SOLN
Status: DC | PRN
  Administered 2016-03-29: 3000 mL

## 2016-03-29 MED ORDER — METHOCARBAMOL 500 MG PO TABS
500.0000 mg | ORAL_TABLET | Freq: Four times a day (QID) | ORAL | Status: DC | PRN
  Administered 2016-03-29 – 2016-03-31 (×4): 500 mg via ORAL
  Filled 2016-03-29 (×3): qty 1

## 2016-03-29 MED ORDER — MENTHOL 3 MG MT LOZG: 1.0000 | LOZENGE | OROMUCOSAL | Status: DC | PRN

## 2016-03-29 MED ORDER — PROPOFOL 500 MG/50ML IV EMUL
INTRAVENOUS | Status: DC | PRN
  Administered 2016-03-29: 100 ug/kg/min via INTRAVENOUS

## 2016-03-29 MED ORDER — CHLORHEXIDINE GLUCONATE 4 % EX LIQD: 60.0000 mL | Freq: Once | CUTANEOUS | Status: DC

## 2016-03-29 MED ORDER — BUPIVACAINE HCL (PF) 0.25 % IJ SOLN
INTRAMUSCULAR | Status: AC
  Filled 2016-03-29: qty 30

## 2016-03-29 MED ORDER — LIDOCAINE 2% (20 MG/ML) 5 ML SYRINGE
INTRAMUSCULAR | Status: AC
  Filled 2016-03-29: qty 5

## 2016-03-29 MED ORDER — ACETAMINOPHEN 650 MG RE SUPP
650.0000 mg | Freq: Four times a day (QID) | RECTAL | Status: DC | PRN
Start: 2016-03-29 — End: 2016-03-31

## 2016-03-29 MED ORDER — FENTANYL CITRATE (PF) 100 MCG/2ML IJ SOLN
INTRAMUSCULAR | Status: AC
  Filled 2016-03-29: qty 2

## 2016-03-29 MED ORDER — FENTANYL CITRATE (PF) 100 MCG/2ML IJ SOLN
INTRAMUSCULAR | Status: AC
  Administered 2016-03-29: 100 ug
  Filled 2016-03-29: qty 2

## 2016-03-29 MED ORDER — PHENOL 1.4 % MT LIQD: 1.0000 | OROMUCOSAL | Status: DC | PRN

## 2016-03-29 MED ORDER — MIDAZOLAM HCL 2 MG/2ML IJ SOLN
INTRAMUSCULAR | Status: AC
  Filled 2016-03-29: qty 2

## 2016-03-29 MED ORDER — FENTANYL CITRATE (PF) 100 MCG/2ML IJ SOLN
INTRAMUSCULAR | Status: DC | PRN
  Administered 2016-03-29: 50 ug via INTRAVENOUS

## 2016-03-29 MED ORDER — HYDROMORPHONE HCL 1 MG/ML IJ SOLN
0.5000 mg | INTRAMUSCULAR | Status: DC | PRN
  Administered 2016-03-29: 1 mg via INTRAVENOUS

## 2016-03-29 MED ORDER — HYDROMORPHONE HCL 1 MG/ML IJ SOLN
INTRAMUSCULAR | Status: AC
  Filled 2016-03-29: qty 1

## 2016-03-29 MED ORDER — OXYCODONE HCL 5 MG PO TABS
5.0000 mg | ORAL_TABLET | ORAL | Status: DC | PRN
  Administered 2016-03-29 – 2016-03-30 (×6): 10 mg via ORAL
  Administered 2016-03-30: 5 mg via ORAL
  Administered 2016-03-31 (×2): 10 mg via ORAL
  Filled 2016-03-29 (×8): qty 2

## 2016-03-29 MED ORDER — ONDANSETRON HCL 4 MG PO TABS: 4.0000 mg | ORAL_TABLET | Freq: Four times a day (QID) | ORAL | Status: DC | PRN

## 2016-03-29 MED ORDER — DEXTROSE 5 % IV SOLN
INTRAVENOUS | Status: DC | PRN
  Administered 2016-03-29: 30 ug/min via INTRAVENOUS

## 2016-03-29 MED ORDER — HYDROMORPHONE HCL 2 MG/ML IJ SOLN: 0.5000 mg | INTRAMUSCULAR | Status: DC | PRN

## 2016-03-29 MED ORDER — PROPOFOL 10 MG/ML IV BOLUS
INTRAVENOUS | Status: DC | PRN
  Administered 2016-03-29 (×2): 10 mg via INTRAVENOUS

## 2016-03-29 MED ORDER — HYDROCHLOROTHIAZIDE 25 MG PO TABS
25.0000 mg | ORAL_TABLET | Freq: Every day | ORAL | Status: DC
  Administered 2016-03-29 – 2016-03-31 (×3): 25 mg via ORAL
  Filled 2016-03-29 (×3): qty 1

## 2016-03-29 MED ORDER — LIDOCAINE 2% (20 MG/ML) 5 ML SYRINGE
INTRAMUSCULAR | Status: DC | PRN
  Administered 2016-03-29: 60 mg via INTRAVENOUS

## 2016-03-29 MED ORDER — METHOCARBAMOL 1000 MG/10ML IJ SOLN
500.0000 mg | Freq: Four times a day (QID) | INTRAVENOUS | Status: DC | PRN
  Filled 2016-03-29: qty 5

## 2016-03-29 MED ORDER — SODIUM CHLORIDE 0.9 % IV SOLN
INTRAVENOUS | Status: DC
  Administered 2016-03-29: via INTRAVENOUS

## 2016-03-29 MED ORDER — BUPIVACAINE HCL (PF) 0.25 % IJ SOLN
INTRAMUSCULAR | Status: DC | PRN
  Administered 2016-03-29: 30 mL

## 2016-03-29 MED ORDER — MIDAZOLAM HCL 2 MG/2ML IJ SOLN
INTRAMUSCULAR | Status: AC
  Administered 2016-03-29: 2 mg via INTRAVENOUS
  Filled 2016-03-29: qty 2

## 2016-03-29 MED ORDER — ASPIRIN EC 325 MG PO TBEC
325.0000 mg | DELAYED_RELEASE_TABLET | Freq: Every day | ORAL | Status: DC
  Administered 2016-03-30 – 2016-03-31 (×2): 325 mg via ORAL
  Filled 2016-03-29 (×2): qty 1

## 2016-03-29 MED ORDER — ACETAMINOPHEN 325 MG PO TABS
650.0000 mg | ORAL_TABLET | Freq: Four times a day (QID) | ORAL | Status: DC | PRN
  Administered 2016-03-29 – 2016-03-31 (×3): 650 mg via ORAL
  Filled 2016-03-29 (×3): qty 2

## 2016-03-29 MED ORDER — LACTATED RINGERS IV SOLN
INTRAVENOUS | Status: DC
  Administered 2016-03-29: 11:00:00 via INTRAVENOUS

## 2016-03-29 MED ORDER — LOSARTAN POTASSIUM 50 MG PO TABS
100.0000 mg | ORAL_TABLET | Freq: Every day | ORAL | Status: DC
  Administered 2016-03-29 – 2016-03-31 (×3): 100 mg via ORAL
  Filled 2016-03-29 (×3): qty 2

## 2016-03-29 MED ORDER — PROPOFOL 10 MG/ML IV BOLUS
INTRAVENOUS | Status: AC
  Filled 2016-03-29: qty 20

## 2016-03-29 MED ORDER — ONDANSETRON HCL 4 MG/2ML IJ SOLN: 4.0000 mg | Freq: Four times a day (QID) | INTRAMUSCULAR | Status: DC | PRN

## 2016-03-29 MED ORDER — BUPIVACAINE LIPOSOME 1.3 % IJ SUSP
20.0000 mL | INTRAMUSCULAR | Status: AC
  Administered 2016-03-29: 20 mL
  Filled 2016-03-29: qty 20

## 2016-03-29 MED ORDER — HYDROMORPHONE HCL 1 MG/ML IJ SOLN
0.2500 mg | INTRAMUSCULAR | Status: DC | PRN
  Administered 2016-03-29 (×4): 0.5 mg via INTRAVENOUS

## 2016-03-29 MED ORDER — MEPERIDINE HCL 25 MG/ML IJ SOLN: 6.2500 mg | INTRAMUSCULAR | Status: DC | PRN

## 2016-03-29 MED ORDER — OXYCODONE HCL 5 MG PO TABS
ORAL_TABLET | ORAL | Status: AC
  Administered 2016-03-29: 10 mg via ORAL
  Filled 2016-03-29: qty 2

## 2016-03-29 MED ORDER — LOSARTAN POTASSIUM-HCTZ 100-25 MG PO TABS: 1.0000 | ORAL_TABLET | Freq: Every day | ORAL | Status: DC

## 2016-03-29 MED ORDER — CEFAZOLIN IN D5W 1 GM/50ML IV SOLN
1.0000 g | Freq: Three times a day (TID) | INTRAVENOUS | Status: AC
  Administered 2016-03-29 – 2016-03-30 (×2): 1 g via INTRAVENOUS
  Filled 2016-03-29 (×2): qty 50

## 2016-03-29 MED ORDER — HYDROMORPHONE HCL 2 MG/ML IJ SOLN
INTRAMUSCULAR | Status: AC
  Filled 2016-03-29: qty 1

## 2016-03-29 MED ORDER — ATORVASTATIN CALCIUM 10 MG PO TABS
10.0000 mg | ORAL_TABLET | Freq: Every day | ORAL | Status: DC
  Administered 2016-03-30 – 2016-03-31 (×2): 10 mg via ORAL
  Filled 2016-03-29 (×2): qty 1

## 2016-03-29 MED ORDER — MIDAZOLAM HCL 2 MG/2ML IJ SOLN
0.5000 mg | Freq: Once | INTRAMUSCULAR | Status: AC
  Administered 2016-03-29: 2 mg via INTRAVENOUS

## 2016-03-29 MED ORDER — METOCLOPRAMIDE HCL 5 MG PO TABS: 5.0000 mg | ORAL_TABLET | Freq: Three times a day (TID) | ORAL | Status: DC | PRN

## 2016-03-29 MED ORDER — 0.9 % SODIUM CHLORIDE (POUR BTL) OPTIME
TOPICAL | Status: DC | PRN
  Administered 2016-03-29: 1000 mL

## 2016-03-29 MED ORDER — METOCLOPRAMIDE HCL 5 MG/ML IJ SOLN: 5.0000 mg | Freq: Three times a day (TID) | INTRAMUSCULAR | Status: DC | PRN

## 2016-03-29 MED ORDER — METHOCARBAMOL 500 MG PO TABS
ORAL_TABLET | ORAL | Status: AC
  Administered 2016-03-29: 500 mg via ORAL
  Filled 2016-03-29: qty 1

## 2016-03-29 MED ORDER — MIDAZOLAM HCL 2 MG/2ML IJ SOLN
INTRAMUSCULAR | Status: AC
  Administered 2016-03-29: 2 mg
  Filled 2016-03-29: qty 2

## 2016-03-29 MED ORDER — ONDANSETRON HCL 4 MG/2ML IJ SOLN
4.0000 mg | Freq: Once | INTRAMUSCULAR | Status: DC | PRN
Start: 2016-03-29 — End: 2016-03-29

## 2016-03-29 SURGICAL SUPPLY — 70 items
BANDAGE ACE 4X5 VEL STRL LF (GAUZE/BANDAGES/DRESSINGS) ×3 IMPLANT
BANDAGE ESMARK 6X9 LF (GAUZE/BANDAGES/DRESSINGS) ×1 IMPLANT
BENZOIN TINCTURE PRP APPL 2/3 (GAUZE/BANDAGES/DRESSINGS) ×3 IMPLANT
BLADE SAGITTAL 25.0X1.19X90 (BLADE) IMPLANT
BLADE SAGITTAL 25.0X1.19X90MM (BLADE)
BLADE SAGITTAL 25.0X1.27X90 (BLADE) ×2 IMPLANT
BLADE SAGITTAL 25.0X1.27X90MM (BLADE) ×1
BLADE SAW SGTL 13X75X1.27 (BLADE) ×3 IMPLANT
BLADE SURG 10 STRL SS (BLADE) ×3 IMPLANT
BNDG ELASTIC 6X10 VLCR STRL LF (GAUZE/BANDAGES/DRESSINGS) ×3 IMPLANT
BNDG ESMARK 6X9 LF (GAUZE/BANDAGES/DRESSINGS) ×3
BOWL SMART MIX CTS (DISPOSABLE) ×3 IMPLANT
CAPT KNEE TOTAL 3 ATTUNE ×3 IMPLANT
CEMENT HV SMART SET (Cement) ×6 IMPLANT
CLOSURE WOUND 1/2 X4 (GAUZE/BANDAGES/DRESSINGS) ×2
COVER SURGICAL LIGHT HANDLE (MISCELLANEOUS) ×3 IMPLANT
CUFF TOURNIQUET SINGLE 34IN LL (TOURNIQUET CUFF) ×3 IMPLANT
CUFF TOURNIQUET SINGLE 44IN (TOURNIQUET CUFF) IMPLANT
DECANTER SPIKE VIAL GLASS SM (MISCELLANEOUS) ×3 IMPLANT
DRAPE ORTHO SPLIT 77X108 STRL (DRAPES) ×4
DRAPE SURG ORHT 6 SPLT 77X108 (DRAPES) ×2 IMPLANT
DRAPE U-SHAPE 47X51 STRL (DRAPES) ×3 IMPLANT
DRSG PAD ABDOMINAL 8X10 ST (GAUZE/BANDAGES/DRESSINGS) ×6 IMPLANT
DURAPREP 26ML APPLICATOR (WOUND CARE) ×3 IMPLANT
ELECT REM PT RETURN 9FT ADLT (ELECTROSURGICAL) ×3
ELECTRODE REM PT RTRN 9FT ADLT (ELECTROSURGICAL) ×1 IMPLANT
EVACUATOR 1/8 PVC DRAIN (DRAIN) IMPLANT
FACESHIELD WRAPAROUND (MASK) ×6 IMPLANT
GAUZE SPONGE 4X4 12PLY STRL (GAUZE/BANDAGES/DRESSINGS) ×6 IMPLANT
GAUZE XEROFORM 5X9 LF (GAUZE/BANDAGES/DRESSINGS) ×3 IMPLANT
GLOVE BIOGEL PI IND STRL 8 (GLOVE) ×2 IMPLANT
GLOVE BIOGEL PI INDICATOR 8 (GLOVE) ×4
GLOVE ORTHO TXT STRL SZ7.5 (GLOVE) ×6 IMPLANT
GOWN STRL REUS W/ TWL LRG LVL3 (GOWN DISPOSABLE) ×1 IMPLANT
GOWN STRL REUS W/ TWL XL LVL3 (GOWN DISPOSABLE) ×1 IMPLANT
GOWN STRL REUS W/TWL 2XL LVL3 (GOWN DISPOSABLE) ×3 IMPLANT
GOWN STRL REUS W/TWL LRG LVL3 (GOWN DISPOSABLE) ×2
GOWN STRL REUS W/TWL XL LVL3 (GOWN DISPOSABLE) ×2
HANDPIECE INTERPULSE COAX TIP (DISPOSABLE) ×2
IMMOBILIZER KNEE 22 (SOFTGOODS) ×3 IMPLANT
IMMOBILIZER KNEE 22 UNIV (SOFTGOODS) ×3 IMPLANT
KIT BASIN OR (CUSTOM PROCEDURE TRAY) ×3 IMPLANT
KIT ROOM TURNOVER OR (KITS) ×3 IMPLANT
MANIFOLD NEPTUNE II (INSTRUMENTS) ×3 IMPLANT
MARKER SKIN DUAL TIP RULER LAB (MISCELLANEOUS) ×3 IMPLANT
NEEDLE HYPO 25GX1X1/2 BEV (NEEDLE) ×3 IMPLANT
NS IRRIG 1000ML POUR BTL (IV SOLUTION) ×3 IMPLANT
PACK TOTAL JOINT (CUSTOM PROCEDURE TRAY) ×3 IMPLANT
PAD ARMBOARD 7.5X6 YLW CONV (MISCELLANEOUS) ×6 IMPLANT
PAD CAST 4YDX4 CTTN HI CHSV (CAST SUPPLIES) ×1 IMPLANT
PADDING CAST COTTON 4X4 STRL (CAST SUPPLIES) ×2
PADDING CAST COTTON 6X4 STRL (CAST SUPPLIES) ×3 IMPLANT
SET HNDPC FAN SPRY TIP SCT (DISPOSABLE) ×1 IMPLANT
STAPLER VISISTAT 35W (STAPLE) ×3 IMPLANT
STRIP CLOSURE SKIN 1/2X4 (GAUZE/BANDAGES/DRESSINGS) ×4 IMPLANT
SUCTION FRAZIER HANDLE 10FR (MISCELLANEOUS) ×2
SUCTION TUBE FRAZIER 10FR DISP (MISCELLANEOUS) ×1 IMPLANT
SUT VIC AB 0 CT1 27 (SUTURE) ×4
SUT VIC AB 0 CT1 27XBRD ANBCTR (SUTURE) ×2 IMPLANT
SUT VIC AB 1 CTX 36 (SUTURE) ×4
SUT VIC AB 1 CTX36XBRD ANBCTR (SUTURE) ×2 IMPLANT
SUT VIC AB 2-0 CT1 27 (SUTURE) ×4
SUT VIC AB 2-0 CT1 TAPERPNT 27 (SUTURE) ×2 IMPLANT
SUT VIC AB 3-0 X1 27 (SUTURE) ×3 IMPLANT
SYR 50ML SLIP (SYRINGE) ×3 IMPLANT
SYR CONTROL 10ML LL (SYRINGE) ×3 IMPLANT
TOWEL OR 17X24 6PK STRL BLUE (TOWEL DISPOSABLE) ×3 IMPLANT
TOWEL OR 17X26 10 PK STRL BLUE (TOWEL DISPOSABLE) ×3 IMPLANT
TRAY CATH 16FR W/PLASTIC CATH (SET/KITS/TRAYS/PACK) IMPLANT
YANKAUER SUCT BULB TIP NO VENT (SUCTIONS) ×3 IMPLANT

## 2016-03-29 NOTE — Progress Notes (Signed)
Orthopedic Tech Progress Note Patient Details:  Jesse Ali 1951/04/16 [id number]  CPM Left Knee CPM Left Knee: On Left Knee Flexion (Degrees): 90 Left Knee Extension (Degrees): 0 Additional Comments: trapeze bar patient helper   Hildred Priest 03/29/2016, 5:27 PM Viewed order from doctor's order list

## 2016-03-29 NOTE — Anesthesia Preprocedure Evaluation (Signed)
Anesthesia Evaluation  Patient identified by MRN, date of birth, ID band Patient awake    Reviewed: Allergy & Precautions  Airway Mallampati: II  TM Distance: >3 FB Neck ROM: Full    Dental   Pulmonary sleep apnea , former smoker,    Pulmonary exam normal        Cardiovascular hypertension, Pt. on medications Normal cardiovascular exam     Neuro/Psych    GI/Hepatic   Endo/Other    Renal/GU Renal InsufficiencyRenal disease     Musculoskeletal   Abdominal   Peds  Hematology   Anesthesia Other Findings   Reproductive/Obstetrics                             Anesthesia Physical Anesthesia Plan  ASA: II  Anesthesia Plan: Spinal and MAC   Post-op Pain Management:  Regional for Post-op pain   Induction: Intravenous  Airway Management Planned: Simple Face Mask  Additional Equipment:   Intra-op Plan:   Post-operative Plan:   Informed Consent: I have reviewed the patients History and Physical, chart, labs and discussed the procedure including the risks, benefits and alternatives for the proposed anesthesia with the patient or authorized representative who has indicated his/her understanding and acceptance.     Plan Discussed with: CRNA and Surgeon  Anesthesia Plan Comments:         Anesthesia Quick Evaluation

## 2016-03-29 NOTE — Anesthesia Postprocedure Evaluation (Signed)
Anesthesia Post Note  Patient: Jesse Ali  Procedure(s) Performed: Procedure(s) (LRB): LEFT TOTAL KNEE ARTHROPLASTY (Left)  Patient location during evaluation: PACU Anesthesia Type: Spinal Level of consciousness: oriented and awake and alert Pain management: pain level controlled Vital Signs Assessment: post-procedure vital signs reviewed and stable Respiratory status: spontaneous breathing, respiratory function stable and patient connected to nasal cannula oxygen Cardiovascular status: blood pressure returned to baseline and stable Postop Assessment: no headache and no backache Anesthetic complications: no    Last Vitals:  Vitals:   03/29/16 1250 03/29/16 1532  BP:  128/87  Pulse: (!) 54 (!) 59  Resp: 15 15  Temp:  36.5 C    Last Pain:  Vitals:   03/29/16 1002  TempSrc: Oral                 Fenix Rorke DAVID

## 2016-03-29 NOTE — Transfer of Care (Signed)
Immediate Anesthesia Transfer of Care Note  Patient: Jesse Ali  Procedure(s) Performed: Procedure(s): LEFT TOTAL KNEE ARTHROPLASTY (Left)  Patient Location: PACU  Anesthesia Type:GA combined with regional for post-op pain  Level of Consciousness: awake, alert , oriented and patient cooperative  Airway & Oxygen Therapy: Patient Spontanous Breathing and Patient connected to nasal cannula oxygen  Post-op Assessment: Report given to RN, Post -op Vital signs reviewed and stable and Patient moving all extremities  Post vital signs: Reviewed and stable  Last Vitals:  Vitals:   03/29/16 1250 03/29/16 1532  BP:    Pulse: (!) 54   Resp: 15   Temp:  (P) 36.5 C    Last Pain:  Vitals:   03/29/16 1002  TempSrc: Oral      Patients Stated Pain Goal: 3 ([id number])  Complications: No apparent anesthesia complications

## 2016-03-29 NOTE — H&P (Signed)
TOTAL KNEE ADMISSION H&P  Patient is being admitted for left total knee arthroplasty.  Subjective:  Chief Complaint:left knee pain.  HPI: Jesse Ali, 65 y.o. male, has a history of pain and functional disability in the left knee due to arthritis and has failed non-surgical conservative treatments for greater than 12 weeks to includeNSAID's and/or analgesics, corticosteriod injections, use of assistive devices, weight reduction as appropriate and activity modification.  Onset of symptoms was gradual, starting 10 years ago with gradually worsening course since that time.knee(s).  Patient currently rates pain in the right knee(s) at 10 out of 10 with activity. Patient has night pain, worsening of pain with activity and weight bearing, pain with passive range of motion, crepitus and joint swelling.  Patient has evidence of subchondral cysts, subchondral sclerosis, periarticular osteophytes and joint space narrowing by imaging studies.  There is no active infection.  Patient Active Problem List   Diagnosis Date Noted  . Bilateral primary osteoarthritis of knee 02/08/2016   Past Medical History:  Diagnosis Date  . Arthritis    fingers  . CKD (chronic kidney disease), stage III   . Hyperlipidemia   . Hypertension   . Sleep apnea    wears CPAP   . Wears glasses   . Wears glasses     Past Surgical History:  Procedure Laterality Date  . APPENDECTOMY    . COLONOSCOPY    . KNEE ARTHROSCOPY Right 1998  . KNEE ARTHROSCOPY Left 1985  . SHOULDER SURGERY Right 2007   removal bone spurs    Prescriptions Prior to Admission  Medication Sig Dispense Refill Last Dose  . aspirin EC 81 MG tablet Take 81 mg by mouth daily at 3 pm.   Past Month at Unknown time  . atorvastatin (LIPITOR) 10 MG tablet Take 10 mg by mouth daily at 3 pm. 1600   03/28/2016 at Unknown time  . losartan-hydrochlorothiazide (HYZAAR) 100-25 MG tablet Take 1 tablet by mouth daily at 3 pm.    03/28/2016 at Unknown time  .  naproxen sodium (ANAPROX) 220 MG tablet Take 440 mg by mouth daily at 3 pm.    Past Month at Unknown time   Allergies  Allergen Reactions  . No Known Allergies     Social History  Substance Use Topics  . Smoking status: Former Research scientist (life sciences)  . Smokeless tobacco: Never Used     Comment: quit smoking 33 years ago  . Alcohol use No    History reviewed. No pertinent family history.   Review of Systems  Constitutional: Negative.   HENT: Negative.   Respiratory: Negative.   Cardiovascular: Negative.   Musculoskeletal: Positive for joint pain.  Skin: Negative.   Neurological: Negative.   Psychiatric/Behavioral: Negative.     Objective:  Physical Exam  Constitutional: He is oriented to person, place, and time. No distress.  HENT:  Head: Normocephalic and atraumatic.  Eyes: EOM are normal. Pupils are equal, round, and reactive to light.  Neck: Normal range of motion.  Respiratory: No respiratory distress.  GI: He exhibits no distension.  Musculoskeletal: He exhibits tenderness.  Neurological: He is alert and oriented to person, place, and time.  Skin: Skin is warm and dry.  Psychiatric: He has a normal mood and affect.    Vital signs in last 24 hours: Temp:  [98 F (36.7 C)] 98 F (36.7 C) (12/13 1002) Pulse Rate:  [58] 58 (12/13 1002) Resp:  [18] 18 (12/13 1002) BP: (148)/(63) 148/63 (12/13 1002) SpO2:  [99 %]  99 % (12/13 1002) Weight:  [257 lb (116.6 kg)] 257 lb (116.6 kg) (12/13 1002)  Labs:   Estimated body mass index is 37.95 kg/m as calculated from the following:   Height as of 03/21/16: 5\' 9"  (1.753 m).   Weight as of this encounter: 257 lb (116.6 kg).   Imaging Review Plain radiographs demonstrate moderate degenerative joint disease of the left knee(s). The overall alignment ismild varus. The bone quality appears to be good for age and reported activity level.  Assessment/Plan:  End stage arthritis, left knee   The patient history, physical examination,  clinical judgment of the provider and imaging studies are consistent with end stage degenerative joint disease of the left knee(s) and total knee arthroplasty is deemed medically necessary. The treatment options including medical management, injection therapy arthroscopy and arthroplasty were discussed at length. The risks and benefits of total knee arthroplasty were presented and reviewed. The risks due to aseptic loosening, infection, stiffness, patella tracking problems, thromboembolic complications and other imponderables were discussed. The patient acknowledged the explanation, agreed to proceed with the plan and consent was signed. Patient is being admitted for inpatient treatment for surgery, pain control, PT, OT, prophylactic antibiotics, VTE prophylaxis, progressive ambulation and ADL's and discharge planning. The patient is planning to be discharged home with home health services

## 2016-03-29 NOTE — Op Note (Signed)
Preop diagnosis: Left knee primary osteoarthritis  Postop diagnosis: Same  Procedure: Left cemented total knee arthroplasty.  Surgeon: Rodell Perna M.D.  Assistant: Benjiman Core PA-C medically necessary and present for the entire procedure  Anesthesia is spinal plus abductor block plus Marcaine and expiratory L.  Tourniquet time: Less than 1 hour 350    Implants    ATTUNE Depuy #5 femur #7 tibia 5 mm spacer rotating platform 38 mm patella  Procedure: After standard prepping and draping preoperative Ancef prophylaxis timeout procedure with proximal thigh tourniquet extremity sheets drapes sterile skin marker and Betadine Steri-Drape were applied. Levels wrapped with an Esmarch tourniquet was inflated timeout procedure had been completed. Midline incision was made medial parapatellar incision patella was everted cut 10 mm off. Intramedullary hole drilled in the femur starting in the middle of the notch and sizing's were consistent with a #5. Anterior posterior chamfer cuts were made box cut was made. Proximal femur cut was made and the trial sizer showed 5 given nice fit full extension. He'll preparation complete day cruciate ligament resection. Posterior spurs were taken off the medial femoral condyle all meniscal remnants were resected with a scalpel. Chamfer cuts were then made on the femur box cut was made pulse lavage trial sizing's. Vacuum mixing of the cement cementing the tibia femur placement of the permanent patellar 5 mm spacer and then 38 mm patella 3 peg. Ligaments had been drilled femur was impacted down flush. Lavage followed by standard layered closure after hemostasis with the tourniquet deflation after 15 minutes when the cement was hard. Pulse lavage been used hemostasis standard layer closure and septic her skin closure expert was injected prior to closure.

## 2016-03-29 NOTE — Anesthesia Procedure Notes (Signed)
Spinal  Patient location during procedure: OR Start time: 03/29/2016 1:00 PM End time: 03/29/2016 1:03 PM Staffing Anesthesiologist: Lillia Abed Performed: anesthesiologist  Preanesthetic Checklist Completed: patient identified, surgical consent, pre-op evaluation, timeout performed, IV checked, risks and benefits discussed and monitors and equipment checked Spinal Block Patient position: sitting Prep: DuraPrep Patient monitoring: heart rate, cardiac monitor, continuous pulse ox and blood pressure Approach: right paramedian Location: L3-4 Injection technique: single-shot Needle Needle type: Pencan  Needle gauge: 24 G Needle length: 9 cm Needle insertion depth: 9 cm

## 2016-03-29 NOTE — Anesthesia Procedure Notes (Addendum)
Anesthesia Regional Block:  Adductor canal block  Pre-Anesthetic Checklist: ,, timeout performed, Correct Patient, Correct Site, Correct Laterality, Correct Procedure, Correct Position, site marked, Risks and benefits discussed,  Surgical consent,  Pre-op evaluation,  At surgeon's request and post-op pain management  Laterality: Left  Prep: chloraprep       Needles:  Injection technique: Single-shot  Needle Type: Echogenic Stimulator Needle     Needle Length: 9cm 9 cm Needle Gauge: 21 and 21 G    Additional Needles:  Procedures: ultrasound guided (picture in chart) Adductor canal block Narrative:  Start time: 03/29/2016 12:35 PM End time: 03/29/2016 12:45 PM Injection made incrementally with aspirations every 5 mL.  Performed by: Personally  Anesthesiologist: Lillia Abed  Additional Notes: Monitors applied. Patient sedated. Sterile prep and drape,hand hygiene and sterile gloves were used. Relevant anatomy identified.Needle position confirmed.Local anesthetic injected incrementally after negative aspiration. Local anesthetic spread visualized around nerve(s). Vascular puncture avoided. No complications. Image printed for medical record.The patient tolerated the procedure well.    Lillia Abed MD

## 2016-03-30 ENCOUNTER — Encounter (HOSPITAL_COMMUNITY): Payer: Self-pay | Admitting: Orthopaedic Surgery

## 2016-03-30 LAB — BASIC METABOLIC PANEL
Anion gap: 7 (ref 5–15)
BUN: 23 mg/dL — ABNORMAL HIGH (ref 6–20)
CALCIUM: 8.3 mg/dL — AB (ref 8.9–10.3)
CO2: 25 mmol/L (ref 22–32)
CREATININE: 1.59 mg/dL — AB (ref 0.61–1.24)
Chloride: 105 mmol/L (ref 101–111)
GFR calc non Af Amer: 44 mL/min — ABNORMAL LOW (ref >60)
GFR, EST AFRICAN AMERICAN: 51 mL/min — AB (ref >60)
GLUCOSE: 110 mg/dL — AB (ref 65–99)
Potassium: 3.9 mmol/L (ref 3.5–5.1)
Sodium: 137 mmol/L (ref 135–145)

## 2016-03-30 LAB — CBC
HEMATOCRIT: 31.1 % — AB (ref 39.0–52.0)
Hemoglobin: 10.3 g/dL — ABNORMAL LOW (ref 13.0–17.0)
MCH: 31.5 pg (ref 26.0–34.0)
MCHC: 33.1 g/dL (ref 30.0–36.0)
MCV: 95.1 fL (ref 78.0–100.0)
Platelets: 165 10*3/uL (ref 150–400)
RBC: 3.27 MIL/uL — ABNORMAL LOW (ref 4.22–5.81)
RDW: 13.4 % (ref 11.5–15.5)
WBC: 11.1 10*3/uL — ABNORMAL HIGH (ref 4.0–10.5)

## 2016-03-30 NOTE — Evaluation (Signed)
Physical Therapy Evaluation Patient Details Name: Jesse Ali MRN: [id number] DOB: 05-21-1950 Today's Date: 03/30/2016   History of Present Illness  65 yo male admitted on 03/29/16 for L TKA. PMH significant for OA, CKD stage 3, and HTN.  Clinical Impression  Pt is POD 1 and moving well with therapy. Pt is anxious about getting home this afternoon, but DC is possible for tomorrow. Prior to admission, pt was completely independent with all ADLs and IADLS including working outdoors and indoors daily. Pt will benefit from HHPT upon DC in order to maximize functional outcomes. Pt will benefit from continuing to be seen acutely in order to address the below deficits before discharge.     Follow Up Recommendations Home health PT    Equipment Recommendations  None recommended by PT    Recommendations for Other Services       Precautions / Restrictions Precautions Precautions: Knee Precaution Booklet Issued: Yes (comment) Precaution Comments: Handout given and reviewed now pillow under knee Required Braces or Orthoses: Knee Immobilizer - Left Knee Immobilizer - Left: On except when in CPM Restrictions Weight Bearing Restrictions: Yes LLE Weight Bearing: Weight bearing as tolerated      Mobility  Bed Mobility               General bed mobility comments: Pt OOB when PT arrives  Transfers Overall transfer level: Needs assistance Equipment used: Rolling walker (2 wheeled) Transfers: Sit to/from Stand Sit to Stand: Min guard         General transfer comment: Min gaurd for safety from recliner  Ambulation/Gait Ambulation/Gait assistance: Min guard Ambulation Distance (Feet): 150 Feet Assistive device: Rolling walker (2 wheeled) Gait Pattern/deviations: Step-to pattern;Decreased weight shift to left;Antalgic Gait velocity: decreased Gait velocity interpretation: Below normal speed for age/gender General Gait Details: Mild antalgic gait with RW. Cues for heel strike on  LLE  Stairs Stairs: Yes Stairs assistance: Min guard Stair Management: No rails;With walker;Backwards;Step to pattern Number of Stairs: 2 General stair comments: Min gaurd for safety and to stablize RW  Wheelchair Mobility    Modified Rankin (Stroke Patients Only)       Balance Overall balance assessment: Needs assistance Sitting-balance support: No upper extremity supported;Feet supported Sitting balance-Leahy Scale: Fair Sitting balance - Comments: Sitting edge of recliner, no back support   Standing balance support: No upper extremity supported Standing balance-Leahy Scale: Fair Standing balance comment: standing at sink without using RW                             Pertinent Vitals/Pain Pain Assessment: 0-10 Pain Score: 5  Pain Location: left knee Pain Descriptors / Indicators: Sharp;Stabbing Pain Intervention(s): Monitored during session;Premedicated before session;Ice applied;Repositioned    Home Living Family/patient expects to be discharged to:: Private residence Living Arrangements: Spouse/significant other Available Help at Discharge: Family;Available PRN/intermittently Type of Home: House Home Access: Stairs to enter Entrance Stairs-Rails: None Entrance Stairs-Number of Steps: 3 Home Layout: One level Home Equipment: Walker - 2 wheels;Shower seat;Hand held shower head      Prior Function Level of Independence: Independent         Comments: completely independent and working around the house     [employer]   Dominant Hand: Right    Extremity/Trunk Assessment   Upper Extremity Assessment Upper Extremity Assessment: Defer to OT evaluation    Lower Extremity Assessment Lower Extremity Assessment: LLE deficits/detail LLE Deficits / Details: Pt with normal post  op pain and weakness. At leas 3/5 ankle, and 2/5 knee and hip per gross functional asessment.     Cervical / Trunk Assessment Cervical / Trunk Assessment: Normal   Communication   Communication: No difficulties  Cognition Arousal/Alertness: Awake/alert Behavior During Therapy: WFL for tasks assessed/performed Overall Cognitive Status: Within Functional Limits for tasks assessed                      General Comments      Exercises Total Joint Exercises Ankle Circles/Pumps: AROM;Left;20 reps;Supine Quad Sets: PROM;Left;10 reps;Supine Heel Slides: AROM;Left;10 reps;Supine Goniometric ROM: 5-65   Assessment/Plan    PT Assessment Patient needs continued PT services  PT Problem List Decreased strength;Decreased range of motion;Decreased activity tolerance;Decreased balance;Decreased mobility;Decreased knowledge of use of DME;Pain          PT Treatment Interventions DME instruction;Gait training;Stair training;Functional mobility training;Therapeutic activities;Therapeutic exercise;Balance training;Patient/family education    PT Goals (Current goals can be found in the Care Plan section)  Acute Rehab PT Goals Patient Stated Goal: to get home today PT Goal Formulation: With patient Time For Goal Achievement: 04/06/16 Potential to Achieve Goals: Good    Frequency 7X/week   Barriers to discharge        Co-evaluation               End of Session Equipment Utilized During Treatment: Gait belt Activity Tolerance: Patient tolerated treatment well Patient left: in chair;with call bell/phone within reach;with family/visitor present Nurse Communication: Mobility status         Time: 1055-1120 PT Time Calculation (min) (ACUTE ONLY): 25 min   Charges:   PT Evaluation $PT Eval Low Complexity: 1 Procedure PT Treatments $Gait Training: 8-22 mins   PT G Codes:        Scheryl Marten PT, DPT  [phone]  03/30/2016, 12:39 PM

## 2016-03-30 NOTE — Progress Notes (Signed)
Physical Therapy Treatment Patient Details Name: Jesse Ali MRN: [id number] DOB: Jan 29, 1951 Today's Date: 03/30/2016    History of Present Illness 65 yo male admitted on 03/29/16 for L TKA. PMH significant for OA, CKD stage 3, and HTN.    PT Comments    Pt presents with decreased cadence and is received up and walking with is wife prior to this visit. Performed gait training with cues for proper step length and heel strike on LLE in order to improve sequencing. Initiated supine exercises and pt requires assistance for SLR and SAQ with cues for improving quad control during exercises. Pt will require an additional therapy visit and review of stairs prior to DC   Follow Up Recommendations  Home health PT     Equipment Recommendations  None recommended by PT    Recommendations for Other Services       Precautions / Restrictions Precautions Precautions: Knee Precaution Booklet Issued: Yes (comment) Precaution Comments: Handout given and reviewed now pillow under knee Required Braces or Orthoses: Knee Immobilizer - Left Knee Immobilizer - Left: On except when in CPM Restrictions Weight Bearing Restrictions: Yes LLE Weight Bearing: Weight bearing as tolerated    Mobility  Bed Mobility               General bed mobility comments: Pt OOB when PT arrives  Transfers Overall transfer level: Needs assistance Equipment used: Rolling walker (2 wheeled) Transfers: Sit to/from Stand Sit to Stand: Supervision         General transfer comment: Supervision for safety  Ambulation/Gait Ambulation/Gait assistance: Min guard Ambulation Distance (Feet): 75 Feet Assistive device: Rolling walker (2 wheeled) Gait Pattern/deviations: Step-to pattern;Decreased weight shift to left;Antalgic Gait velocity: decreased Gait velocity interpretation: Below normal speed for age/gender General Gait Details: Mild antalgic gait with RW. Cues for heel strike on LLE   Stairs Stairs: Yes    Stair Management: No rails;With walker;Backwards;Step to pattern Number of Stairs: 2 General stair comments: Min gaurd for safety and to stablize RW  Wheelchair Mobility    Modified Rankin (Stroke Patients Only)       Balance Overall balance assessment: Needs assistance Sitting-balance support: No upper extremity supported;Feet supported Sitting balance-Leahy Scale: Fair Sitting balance - Comments: Sitting edge of recliner, no back support   Standing balance support: No upper extremity supported Standing balance-Leahy Scale: Fair Standing balance comment: standing at sink without using RW                    Cognition Arousal/Alertness: Awake/alert Behavior During Therapy: WFL for tasks assessed/performed Overall Cognitive Status: Within Functional Limits for tasks assessed                      Exercises Total Joint Exercises Ankle Circles/Pumps: AROM;Left;20 reps;Supine Quad Sets: AROM;Left;10 reps;Supine Short Arc Quad: AROM;Left;10 reps;Supine Heel Slides: Left;10 reps;Supine;AAROM Hip ABduction/ADduction: AAROM;Left;10 reps;Supine Straight Leg Raises: AAROM;Left;10 reps;Sidelying Goniometric ROM: 5-65    General Comments        Pertinent Vitals/Pain Pain Assessment: 0-10 Pain Score: 4  Pain Location: left knee Pain Descriptors / Indicators: Sharp;Stabbing Pain Intervention(s): Monitored during session;Premedicated before session    [place] expects to be discharged to:: Private residence Living Arrangements: Spouse/significant other Available Help at Discharge: Family;Available PRN/intermittently Type of Home: House Home Access: Stairs to enter Entrance Stairs-Rails: None Home Layout: One level Home Equipment: Environmental consultant - 2 wheels;Shower seat;Hand held shower head      Prior Function Level of  Independence: Independent      Comments: completely independent and working around the house   PT Goals (current goals can now  be found in the care plan section) Acute Rehab PT Goals Patient Stated Goal: to get home today PT Goal Formulation: With patient Time For Goal Achievement: 04/06/16 Potential to Achieve Goals: Good Progress towards PT goals: Progressing toward goals    Frequency    7X/week      PT Plan Current plan remains appropriate    Co-evaluation             End of Session Equipment Utilized During Treatment: Gait belt Activity Tolerance: Patient tolerated treatment well Patient left: in chair;with call bell/phone within reach     Time: [id number] PT Time Calculation (min) (ACUTE ONLY): 16 min  Charges:  $Gait Training: 8-22 mins $Therapeutic Exercise: 8-22 mins                    G Codes:      Scheryl Marten PT, DPT  [phone]  03/30/2016, 2:28 PM

## 2016-03-30 NOTE — Consult Note (Signed)
[facility] CM Primary Care Navigator  03/30/2016  Jesse Ali December 27, 1950 [id number]  Met with patient at the bedside to identify possible discharge needs. Patient reports increased/ worsening pain to left knee had led to this admission/surgery.  Patient endorses Dr. Kennith Maes with [facility] as the primary care provider.    Patient shared using [facility] in [place] to obtain medications without any problem.   Patient reports managing his own medications at home straight out of the containers.   Patient is independent with self care and states being able to drive prior to admission/ surgery. [name]) or [name]) will be able provide transportation to his doctors' appointments after discharge.  He states being the primary caregiver for himself at home but daughter who lives next door or mother- in law who lives across the street will be able to assist with his care if wife is at work according to patient.   Discharge plan is anticipated to home with home health services/ Outpatient Rehab ([facility]) per patient.  Patient voiced understanding to call primary care provider's office, once he gets home for a post discharge follow-up appointment within a week or sooner if needs arise. Patient letter provided as a reminder. Patient mentioned that he has a scheduled appointment to see PCP on 12/20 for follow-up. Encouraged patient's attendance to this PCP appointment.  He denies further needs or concerns at this time.  For additional questions please contact:  Edwena Felty A. Wells Gerdeman, BSN, RN-BC [facility] PRIMARY CARE Navigator Cell: [phone]

## 2016-03-30 NOTE — Evaluation (Signed)
Occupational Therapy Evaluation and Discharge Patient Details Name: Jesse Ali MRN: [id number] DOB: 02/23/1951 Today's Date: 03/30/2016    History of Present Illness 65 yo male admitted on 03/29/16 for L TKA. PMH significant for OA, CKD stage 3, and HTN.   Clinical Impression   PTA Pt very independent in ADL and mobility. Pt currently mod I for ADL (including LB) and supervision for safety with RW. Please see performance level below. Pt has very good family support upon discharge and has no questions or concerns for OT. OT to sign off. Thank you for this referral.     Follow Up Recommendations  No OT follow up;Supervision - Intermittent    Equipment Recommendations  None recommended by OT    Recommendations for Other Services       Precautions / Restrictions Precautions Precautions: Knee Precaution Booklet Issued: Yes (comment) Precaution Comments: Handout given and reviewed now pillow under knee Required Braces or Orthoses: Knee Immobilizer - Left Knee Immobilizer - Left: On except when in CPM Restrictions Weight Bearing Restrictions: Yes LLE Weight Bearing: Weight bearing as tolerated      Mobility Bed Mobility               General bed mobility comments: Pt OOB when OT arrived  Transfers Overall transfer level: Needs assistance Equipment used: Rolling walker (2 wheeled) Transfers: Sit to/from Stand Sit to Stand: Supervision         General transfer comment: Supervision for safety    Balance Overall balance assessment: Needs assistance Sitting-balance support: No upper extremity supported;Feet supported Sitting balance-Leahy Scale: Good Sitting balance - Comments: Sitting edge of recliner, no back support   Standing balance support: No upper extremity supported Standing balance-Leahy Scale: Fair Standing balance comment: standing at sink without using RW                            ADL Overall ADL's : Modified independent                                        General ADL Comments: Pt dressed himself (upper and lower body) and stood at sink for ADL.  mod I for toilet transfer with no use of grab bars. vc for safety with RW (keep closer to body)     Vision     Perception     Praxis      Pertinent Vitals/Pain Pain Assessment: 0-10 Pain Score: 8  Pain Location: left knee with movement Pain Descriptors / Indicators: Tightness;Throbbing;Sore Pain Intervention(s): Limited activity within patient's tolerance;Monitored during session;Repositioned     Hand Dominance Right   Extremity/Trunk Assessment Upper Extremity Assessment Upper Extremity Assessment: Overall WFL for tasks assessed   Lower Extremity Assessment Lower Extremity Assessment: LLE deficits/detail LLE Deficits / Details: Pt with normal post op pain and weakness.   Cervical / Trunk Assessment Cervical / Trunk Assessment: Normal   Communication Communication Communication: No difficulties   Cognition Arousal/Alertness: Awake/alert Behavior During Therapy: WFL for tasks assessed/performed Overall Cognitive Status: Within Functional Limits for tasks assessed                     General Comments       Exercises      Shoulder Instructions      Home Living Family/patient expects to be discharged to:: Private residence Living  Arrangements: Spouse/significant other Available Help at Discharge: Family;Available PRN/intermittently Type of Home: House Home Access: Stairs to enter [employer] of Steps: 3 Entrance Stairs-Rails: None Home Layout: One level     Bathroom Shower/Tub: Occupational psychologist: Standard Bathroom Accessibility: Yes How Accessible: Accessible via walker Home Equipment: [place] - 2 wheels;Shower seat;Hand held shower head          Prior Functioning/Environment Level of Independence: Independent        Comments: completely independent and working around the house         OT Problem List: Decreased strength;Decreased range of motion;Decreased activity tolerance;Impaired balance (sitting and/or standing);Decreased knowledge of use of DME or AE   OT Treatment/Interventions:      OT Goals(Current goals can be found in the care plan section) Acute Rehab OT Goals Patient Stated Goal: to get home tomorrow OT Goal Formulation: With patient Time For Goal Achievement: 04/06/16 Potential to Achieve Goals: Good  OT Frequency:     Barriers to D/C:            Co-evaluation              End of Session Equipment Utilized During Treatment: Gait belt;Rolling walker CPM Left Knee CPM Left Knee: Off Nurse Communication: Mobility status  Activity Tolerance: Patient tolerated treatment well Patient left: in chair;with call bell/phone within reach   Time: [id number] OT Time Calculation (min): 19 min Charges:  OT General Charges $OT Visit: 1 Procedure OT Evaluation $OT Eval Moderate Complexity: 1 Procedure G-Codes:    Merri Ray Hannan Tetzlaff 14-Apr-2016, 5:02 PM  Hulda Humphrey OTR/L [phone]

## 2016-03-30 NOTE — Progress Notes (Signed)
   Subjective: 1 Day Post-Op Procedure(s) (LRB): LEFT TOTAL KNEE ARTHROPLASTY (Left) Patient reports pain as mild and moderate.    Objective: Vital signs in last 24 hours: Temp:  [97.7 F (36.5 C)-101 F (38.3 C)] 98.9 F (37.2 C) (12/14 0410) Pulse Rate:  [54-93] 67 (12/14 0410) Resp:  [8-21] 16 (12/14 0410) BP: (100-152)/(52-107) 107/67 (12/14 0410) SpO2:  [92 %-100 %] 92 % (12/14 0410) Weight:  [257 lb (116.6 kg)] 257 lb (116.6 kg) (12/13 1002)  Intake/Output from previous day: 12/13 0701 - 12/14 0700 In: 240 [P.O.:240] Out: 850 [Urine:800; Blood:50] Intake/Output this shift: No intake/output data recorded.   Recent Labs  03/30/16 0522  HGB 10.3*    Recent Labs  03/30/16 0522  WBC 11.1*  RBC 3.27*  HCT 31.1*  PLT 165    Recent Labs  03/30/16 0522  NA 137  K 3.9  CL 105  CO2 25  BUN 23*  CREATININE 1.59*  GLUCOSE 110*  CALCIUM 8.3*    Recent Labs  03/29/16 1054  INR 1.07    Neurologically intact Dg Knee 1-2 Views Left  Result Date: 03/29/2016 CLINICAL DATA:  Postop EXAM: LEFT KNEE - 1-2 VIEW COMPARISON:  02/08/2016 FINDINGS: The patient is status post left knee replacement with satisfactory alignment. Hardware appears intact. Soft tissue staples anteriorly. Tibial tuberosity ossific opacities are unchanged. Small amount of gas within the joint and soft tissues is felt postoperative. IMPRESSION: Interim left knee replacement with satisfactory alignment and expected postsurgical changes Electronically Signed   By: Donavan Foil M.D.   On: 03/29/2016 16:48    Assessment/Plan: 1 Day Post-Op Procedure(s) (LRB): LEFT TOTAL KNEE ARTHROPLASTY (Left) Up with therapy  Marybelle Killings 03/30/2016, 7:58 AM

## 2016-03-31 ENCOUNTER — Encounter (HOSPITAL_COMMUNITY): Payer: Self-pay | Admitting: General Practice

## 2016-03-31 LAB — CBC
HEMATOCRIT: 31 % — AB (ref 39.0–52.0)
HEMOGLOBIN: 10.4 g/dL — AB (ref 13.0–17.0)
MCH: 30.8 pg (ref 26.0–34.0)
MCHC: 33.5 g/dL (ref 30.0–36.0)
MCV: 91.7 fL (ref 78.0–100.0)
Platelets: 176 10*3/uL (ref 150–400)
RBC: 3.38 MIL/uL — AB (ref 4.22–5.81)
RDW: 13.2 % (ref 11.5–15.5)
WBC: 13.2 10*3/uL — AB (ref 4.0–10.5)

## 2016-03-31 MED ORDER — METHOCARBAMOL 500 MG PO TABS: 500.0000 mg | ORAL_TABLET | Freq: Four times a day (QID) | ORAL | 0 refills | Status: DC | PRN

## 2016-03-31 MED ORDER — OXYCODONE-ACETAMINOPHEN 7.5-325 MG PO TABS: 1.0000 | ORAL_TABLET | Freq: Four times a day (QID) | ORAL | 0 refills | Status: DC | PRN

## 2016-03-31 MED ORDER — ASPIRIN EC 325 MG PO TBEC: 325.0000 mg | DELAYED_RELEASE_TABLET | Freq: Every day | ORAL | 0 refills | Status: DC

## 2016-03-31 NOTE — Progress Notes (Signed)
Physical Therapy Treatment Patient Details Name: Jesse Ali MRN: [id number] DOB: 01/07/1951 Today's Date: 03/31/2016    History of Present Illness 65 yo male admitted on 03/29/16 for L TKA. PMH significant for OA, CKD stage 3, and HTN.    PT Comments    Pt is POD 2 and continues to move well with therapy but is slightly limited by pain. Performed seated knee flexion exercises to improve ROM and pt achieved 75 degrees knee flexion in sitting. Improved tolerance for gait and weight bearing through LLE. Pt has cleared stairs and all education was completed and is ready for DC once approved by Md.    Follow Up Recommendations  Outpatient PT     Equipment Recommendations  None recommended by PT    Recommendations for Other Services       Precautions / Restrictions Precautions Precautions: Knee Precaution Booklet Issued: Yes (comment) Precaution Comments: Handout given and reviewed now pillow under knee Required Braces or Orthoses: Knee Immobilizer - Left Knee Immobilizer - Left: On except when in CPM Restrictions Weight Bearing Restrictions: Yes LLE Weight Bearing: Weight bearing as tolerated    Mobility  Bed Mobility               General bed mobility comments: pt OOB in recliner  Transfers Overall transfer level: Needs assistance Equipment used: Rolling walker (2 wheeled) Transfers: Sit to/from Stand Sit to Stand: Supervision         General transfer comment: Supervision for safety  Ambulation/Gait Ambulation/Gait assistance: Supervision Ambulation Distance (Feet): 150 Feet Assistive device: Rolling walker (2 wheeled) Gait Pattern/deviations: Step-to pattern;Decreased weight shift to left;Antalgic Gait velocity: decreased Gait velocity interpretation: Below normal speed for age/gender General Gait Details: Mild antalgic gait with RW. Cues for heel strike on LLE   Stairs            Wheelchair Mobility    Modified Rankin (Stroke Patients  Only)       Balance Overall balance assessment: Needs assistance Sitting-balance support: No upper extremity supported;Feet supported Sitting balance-Leahy Scale: Good Sitting balance - Comments: Sitting edge of recliner, no back support   Standing balance support: No upper extremity supported Standing balance-Leahy Scale: Fair Standing balance comment: Standing without using RW                    Cognition Arousal/Alertness: Awake/alert Behavior During Therapy: WFL for tasks assessed/performed Overall Cognitive Status: Within Functional Limits for tasks assessed                      Exercises Total Joint Exercises Long Arc Quad: AAROM;Left;10 reps;Seated Knee Flexion: AROM;Left;10 reps;Seated Goniometric ROM: 5-75    General Comments        Pertinent Vitals/Pain Pain Assessment: 0-10 Pain Score: 4  Pain Location: left knee with movement Pain Descriptors / Indicators: Tightness;Throbbing;Sore Pain Intervention(s): Monitored during session;Premedicated before session;Limited activity within patient's tolerance;Repositioned    Home Living                      Prior Function            PT Goals (current goals can now be found in the care plan section) Acute Rehab PT Goals Patient Stated Goal: to get home tomorrow Progress towards PT goals: Progressing toward goals    Frequency    7X/week      PT Plan Current plan remains appropriate    Co-evaluation  End of Session Equipment Utilized During Treatment: Gait belt Activity Tolerance: Patient tolerated treatment well Patient left: in chair;with call bell/phone within reach     Time: 0753-0817 PT Time Calculation (min) (ACUTE ONLY): 24 min  Charges:  $Gait Training: 8-22 mins $Therapeutic Exercise: 8-22 mins                    G Codes:      Scheryl Marten PT, DPT  [phone]  03/31/2016, 8:23 AM

## 2016-03-31 NOTE — Discharge Summary (Signed)
Patient ID: Jesse Ali MRN: [id number] DOB/AGE: 1950-07-28 65 y.o.  Admit date: 03/29/2016 Discharge date: 03/31/2016  Admission Diagnoses:  Active Problems:   Arthritis of left knee   Discharge Diagnoses:  Active Problems:   Arthritis of left knee  status post Procedure(s): LEFT TOTAL KNEE ARTHROPLASTY  Past Medical History:  Diagnosis Date  . Arthritis    fingers  . CKD (chronic kidney disease), stage III   . Hyperlipidemia   . Hypertension   . Sleep apnea    wears CPAP   . Wears glasses   . Wears glasses     Surgeries: Procedure(s): LEFT TOTAL KNEE ARTHROPLASTY on 03/29/2016   Consultants:   Discharged Condition: Improved  Hospital Course: Caedmon Ali is an 65 y.o. male who was admitted 03/29/2016 for operative treatment of end stage DJD knee.. Patient failed conservative treatments (please see the history and physical for the specifics) and had severe unremitting pain that affects sleep, daily activities and work/hobbies. After pre-op clearance, the patient was taken to the operating room on 03/29/2016 and underwent  Procedure(s): LEFT TOTAL KNEE ARTHROPLASTY.    Patient was given perioperative antibiotics: Anti-infectives    Start     Dose/Rate Route Frequency Ordered Stop   03/29/16 2200  ceFAZolin (ANCEF) IVPB 1 g/50 mL premix     1 g 100 mL/hr over 30 Minutes Intravenous Every 8 hours 03/29/16 1615 03/30/16 0625   03/29/16 1200  ceFAZolin (ANCEF) 3 g in dextrose 5 % 50 mL IVPB     3 g 130 mL/hr over 30 Minutes Intravenous To [facility] 03/28/16 1203 03/29/16 1325       Patient was given sequential compression devices and early ambulation to prevent DVT.   Patient benefited maximally from hospital stay and there were no complications. At the time of discharge, the patient was urinating/moving their bowels without difficulty, tolerating a regular diet, pain is controlled with oral pain medications and they have been cleared by PT/OT.    Recent vital signs: Patient Vitals for the past 24 hrs:  BP Temp Temp src Pulse Resp SpO2  03/31/16 0600 - 97.8 F (36.6 C) Oral - - -  03/31/16 0500 99/63 (!) 100.6 F (38.1 C) Oral 79 16 96 %  03/30/16 2133 118/73 100 F (37.8 C) Oral 73 16 96 %  03/30/16 1453 128/64 98 F (36.7 C) - 75 16 98 %     Recent laboratory studies:  Recent Labs  03/29/16 1054 03/30/16 0522 03/31/16 0542  WBC  --  11.1* 13.2*  HGB  --  10.3* 10.4*  HCT  --  31.1* 31.0*  PLT  --  165 176  NA  --  137  --   K  --  3.9  --   CL  --  105  --   CO2  --  25  --   BUN  --  23*  --   CREATININE  --  1.59*  --   GLUCOSE  --  110*  --   INR 1.07  --   --   CALCIUM  --  8.3*  --      Discharge Medications:   Allergies as of 03/31/2016      Reactions   No Known Allergies       Medication List    STOP taking these medications   naproxen sodium 220 MG tablet Commonly known as:  ANAPROX     TAKE these medications   aspirin EC  325 MG tablet Take 1 tablet (325 mg total) by mouth daily. What changed:  medication strength  how much to take  when to take this   atorvastatin 10 MG tablet Commonly known as:  LIPITOR Take 10 mg by mouth daily at 3 pm. 1600   losartan-hydrochlorothiazide 100-25 MG tablet Commonly known as:  HYZAAR Take 1 tablet by mouth daily at 3 pm.   methocarbamol 500 MG tablet Commonly known as:  ROBAXIN Take 1 tablet (500 mg total) by mouth every 6 (six) hours as needed for muscle spasms.   oxyCODONE-acetaminophen 7.5-325 MG tablet Commonly known as:  PERCOCET Take 1 tablet by mouth every 6 (six) hours as needed for severe pain.       Diagnostic Studies: Dg Chest 2 View  Result Date: 03/21/2016 CLINICAL DATA:  Preoperative evaluation for upcoming knee replacement, initial encounter EXAM: CHEST  2 VIEW COMPARISON:  None. FINDINGS: Cardiac shadow is within normal limits. A few scattered calcified granulomas are noted consistent with prior granulomatous disease.  Elevation of left hemidiaphragm is seen. Degenerative change of the thoracic spine is noted. IMPRESSION: Prior granulomatous disease. No acute abnormality seen. Electronically Signed   By: Inez Catalina M.D.   On: 03/21/2016 08:53   Dg Knee 1-2 Views Left  Result Date: 03/29/2016 CLINICAL DATA:  Postop EXAM: LEFT KNEE - 1-2 VIEW COMPARISON:  02/08/2016 FINDINGS: The patient is status post left knee replacement with satisfactory alignment. Hardware appears intact. Soft tissue staples anteriorly. Tibial tuberosity ossific opacities are unchanged. Small amount of gas within the joint and soft tissues is felt postoperative. IMPRESSION: Interim left knee replacement with satisfactory alignment and expected postsurgical changes Electronically Signed   By: Donavan Foil M.D.   On: 03/29/2016 16:48      Follow-up Information    Marybelle Killings, MD. Schedule an appointment as soon as possible for a visit today.   Specialty:  Orthopedic Surgery Why:  need return office visit 2 weeks postop Contact information: [address] [place] [id number] [phone]           Discharge Plan:  discharge to home  Disposition:     Signed: Benjiman Core for mark yates MD 03/31/2016, 1:58 PM

## 2016-03-31 NOTE — Discharge Instructions (Signed)

## 2016-03-31 NOTE — Progress Notes (Signed)
Pt discharge education and instructions completed with pt at bedside; pt voices understanding and denies any questions. Pt IV removed; left knee incision dsg changed. Site clean, dry and intact with no drainage or active bleeding noted. Pt handed his prescriptions for Robaxin, aspirin and percocet. Pt transported off unit via wheelchair with belongings to the side. Delia Heady RN

## 2016-03-31 NOTE — Care Management Note (Signed)
Case Management Note  Patient Details  Name: Jesse Ali MRN: [id number] Date of Birth: May 28, 1950  Subjective/Objective:   65 yr old gentleman s/p left total knee arthroplasty.                 Action/Plan: Case manager spoke with patient and his wife concerning discharge plan and DME. Patient states he will be going to outpatient therapy at [facility]. He will be using his mother-in-laws rolling walker, has no need for 3in1. Wife will assist at discharge, patient has no further needs at this time.    Expected Discharge Date:   03/31/16               Expected Discharge Plan:  Home/Self Care  In-House Referral:  NA  Discharge planning Services  CM Consult  Post Acute Care Choice:  NA Choice offered to:  NA  DME Arranged:  N/A DME Agency:  NA  HH Arranged:  NA HH Agency:  NA  Status of Service:  Completed, signed off  If discussed at [place] of Stay Meetings, dates discussed:    Additional Comments:  Ninfa Meeker, RN 03/31/2016, 10:26 AM

## 2016-04-02 DIAGNOSIS — G4733 Obstructive sleep apnea (adult) (pediatric): Secondary | ICD-10-CM | POA: Diagnosis not present

## 2016-04-03 LAB — TYPE AND SCREEN
BLOOD PRODUCT EXPIRATION DATE: 201712192359
Blood Product Expiration Date: 201712192359
ISSUE DATE / TIME: 201712180009
Unit Type and Rh: 600
Unit Type and Rh: 600

## 2016-04-05 ENCOUNTER — Ambulatory Visit (INDEPENDENT_AMBULATORY_CARE_PROVIDER_SITE_OTHER): Payer: PPO

## 2016-04-05 ENCOUNTER — Encounter (INDEPENDENT_AMBULATORY_CARE_PROVIDER_SITE_OTHER): Payer: Self-pay | Admitting: Orthopaedic Surgery

## 2016-04-05 ENCOUNTER — Ambulatory Visit (INDEPENDENT_AMBULATORY_CARE_PROVIDER_SITE_OTHER): Payer: PPO | Admitting: Orthopaedic Surgery

## 2016-04-05 VITALS — BP 137/82 | HR 66 | Ht 69.0 in | Wt 248.0 lb

## 2016-04-05 DIAGNOSIS — M1712 Unilateral primary osteoarthritis, left knee: Secondary | ICD-10-CM | POA: Diagnosis not present

## 2016-04-05 DIAGNOSIS — Z96652 Presence of left artificial knee joint: Secondary | ICD-10-CM

## 2016-04-05 NOTE — Progress Notes (Signed)
   Post-Op Visit Note   Patient: Jesse Ali           Date of Birth: 04/15/1951           MRN: [id number] Visit Date: 04/05/2016 PCP: Ronita Hipps, MD   Assessment & Plan: Patient did not have the home health sent out to his house. He has flexion to 90 today is 30 extension lag and has been doing his exercises on his own and stopped his pain medication on his own. We'll set up outpatient therapy at [facility].  Chief Complaint: One-week postop left total knee arthroplasty. Visit Diagnoses:  1. Arthritis of left knee   2. Status post total left knee replacement     Plan: Staples removed Steri-Strips applied incision was good he has some ecchymosis at the region of the tourniquet and some mid tibia as expected.  Follow-Up Instructions: Return in about 4 weeks (around 05/03/2016).   Orders:  Orders Placed This Encounter  Procedures  . XR Knee 1-2 Views Left   No orders of the defined types were placed in this encounter.  HPI Patient returns status post left total knee arthroplasty on 03/29/16. He states that he is doing well. He does have swelling and bruising in his left lower leg. He describes a "nagging" feeling and is unable to get comfortable. He has discontinued his pain meds. He is taking Aspirin 325mg .  Staples intact.  PMFS History: Patient Active Problem List   Diagnosis Date Noted  . Arthritis of left knee 03/29/2016  . Bilateral primary osteoarthritis of knee 02/08/2016   Past Medical History:  Diagnosis Date  . Arthritis    fingers  . CKD (chronic kidney disease), stage III   . Hyperlipidemia   . Hypertension   . Sleep apnea    wears CPAP   . Wears glasses   . Wears glasses     No family history on file.  Past Surgical History:  Procedure Laterality Date  . APPENDECTOMY    . COLONOSCOPY    . JOINT REPLACEMENT    . KNEE ARTHROSCOPY Right 1998  . KNEE ARTHROSCOPY Left 1985  . SHOULDER SURGERY Right 2007   removal bone spurs  . TOTAL KNEE  ARTHROPLASTY Left 03/29/2016   Procedure: LEFT TOTAL KNEE ARTHROPLASTY;  Surgeon: Marybelle Killings, MD;  Location: [facility];  Service: Orthopedics;  Laterality: Left;   Social History   Occupational History  . retired    Social History Main Topics  . Smoking status: Former Smoker    Packs/day: 1.00    Years: 20.00    Types: Cigarettes    Quit date: 18  . Smokeless tobacco: Never Used  . Alcohol use No  . Drug use: No  . Sexual activity: Yes    Partners: Female

## 2016-04-06 ENCOUNTER — Telehealth (INDEPENDENT_AMBULATORY_CARE_PROVIDER_SITE_OTHER): Payer: Self-pay | Admitting: *Deleted

## 2016-04-06 NOTE — Telephone Encounter (Signed)
[facility] called requesting any office notes from 12/13 on to help with treatment. [phone]

## 2016-04-07 DIAGNOSIS — M25562 Pain in left knee: Secondary | ICD-10-CM | POA: Diagnosis not present

## 2016-04-07 DIAGNOSIS — R2689 Other abnormalities of gait and mobility: Secondary | ICD-10-CM | POA: Diagnosis not present

## 2016-04-07 DIAGNOSIS — M25662 Stiffness of left knee, not elsewhere classified: Secondary | ICD-10-CM | POA: Diagnosis not present

## 2016-04-14 NOTE — Telephone Encounter (Signed)
faxed

## 2016-05-03 ENCOUNTER — Ambulatory Visit (INDEPENDENT_AMBULATORY_CARE_PROVIDER_SITE_OTHER): Payer: PPO | Admitting: Orthopaedic Surgery

## 2016-05-03 DIAGNOSIS — G4733 Obstructive sleep apnea (adult) (pediatric): Secondary | ICD-10-CM | POA: Diagnosis not present

## 2016-05-12 DIAGNOSIS — H47233 Glaucomatous optic atrophy, bilateral: Secondary | ICD-10-CM | POA: Diagnosis not present

## 2016-05-12 DIAGNOSIS — I10 Essential (primary) hypertension: Secondary | ICD-10-CM | POA: Diagnosis not present

## 2016-05-12 DIAGNOSIS — H52223 Regular astigmatism, bilateral: Secondary | ICD-10-CM | POA: Diagnosis not present

## 2016-05-12 DIAGNOSIS — H18893 Other specified disorders of cornea, bilateral: Secondary | ICD-10-CM | POA: Diagnosis not present

## 2016-05-12 DIAGNOSIS — H5203 Hypermetropia, bilateral: Secondary | ICD-10-CM | POA: Diagnosis not present

## 2016-05-12 DIAGNOSIS — H40013 Open angle with borderline findings, low risk, bilateral: Secondary | ICD-10-CM | POA: Diagnosis not present

## 2016-05-12 DIAGNOSIS — H524 Presbyopia: Secondary | ICD-10-CM | POA: Diagnosis not present

## 2016-05-19 DIAGNOSIS — I10 Essential (primary) hypertension: Secondary | ICD-10-CM | POA: Diagnosis not present

## 2016-05-19 DIAGNOSIS — J309 Allergic rhinitis, unspecified: Secondary | ICD-10-CM | POA: Diagnosis not present

## 2016-05-19 DIAGNOSIS — E785 Hyperlipidemia, unspecified: Secondary | ICD-10-CM | POA: Diagnosis not present

## 2016-05-19 DIAGNOSIS — Z79899 Other long term (current) drug therapy: Secondary | ICD-10-CM | POA: Diagnosis not present

## 2016-05-23 ENCOUNTER — Ambulatory Visit (INDEPENDENT_AMBULATORY_CARE_PROVIDER_SITE_OTHER): Payer: PPO | Admitting: Orthopaedic Surgery

## 2016-05-23 ENCOUNTER — Encounter (INDEPENDENT_AMBULATORY_CARE_PROVIDER_SITE_OTHER): Payer: Self-pay | Admitting: Orthopaedic Surgery

## 2016-05-23 VITALS — BP 152/84 | HR 58 | Ht 69.0 in | Wt 248.0 lb

## 2016-05-23 DIAGNOSIS — Z96652 Presence of left artificial knee joint: Secondary | ICD-10-CM

## 2016-05-23 NOTE — Progress Notes (Signed)
Office Visit Note   Patient: Jesse Ali           Date of Birth: 10/22/1950           MRN: [id number] Visit Date: 05/23/2016              Requested by: Ronita Hipps, MD [address] [place] [address], PCP: Ronita Hipps, MD   Assessment & Plan: Visit Diagnoses:  1. Status post total knee replacement, left     Plan: Patient will continue to work on his range of motion and I did give him some quad strengthening exercises to do since he is complaining of some weakness there when going up and down steps. Will follow-up the office in 3 months for recheck. Return sooner if needed.  Follow-Up Instructions: Return in about 3 months (around 08/20/2016).   Orders:  No orders of the defined types were placed in this encounter.  No orders of the defined types were placed in this encounter.     Procedures: No procedures performed   Clinical Data: No additional findings.   Subjective: Chief Complaint  Patient presents with  . Left Knee - Routine Post Op  . Follow-up    Patient is here for a 4 week ROV, Left TKA on 03/29/16. States that left knee is felling good. Has some questions.    Review of Systems  Constitutional: Negative.   HENT: Negative.   Respiratory: Negative.   Cardiovascular: Negative.   Genitourinary: Negative.   Musculoskeletal: Negative.   Skin: Negative.   Neurological: Negative.   Psychiatric/Behavioral: Negative.      Objective: Vital Signs: BP (!) 152/84   Pulse (!) 58   Ht 5\' 9"  (1.753 m)   Wt 248 lb (112.5 kg)   BMI 36.62 kg/m   Physical Exam  Constitutional: No distress.  HENT:  Head: Normocephalic and atraumatic.  Eyes: EOM are normal. Pupils are equal, round, and reactive to light.  Pulmonary/Chest: No respiratory distress.  Musculoskeletal:  Left knee range of motion about 0-95. Has some swelling without significant palpable effusion. Calf nontender.No signs of infection.    Ortho Exam  Specialty Comments:  No  specialty comments available.  Imaging: No results found.   PMFS History: Patient Active Problem List   Diagnosis Date Noted  . Arthritis of left knee 03/29/2016  . Bilateral primary osteoarthritis of knee 02/08/2016   Past Medical History:  Diagnosis Date  . Arthritis    fingers  . CKD (chronic kidney disease), stage III   . Hyperlipidemia   . Hypertension   . Sleep apnea    wears CPAP   . Wears glasses   . Wears glasses     No family history on file.  Past Surgical History:  Procedure Laterality Date  . APPENDECTOMY    . COLONOSCOPY    . JOINT REPLACEMENT    . KNEE ARTHROSCOPY Right 1998  . KNEE ARTHROSCOPY Left 1985  . SHOULDER SURGERY Right 2007   removal bone spurs  . TOTAL KNEE ARTHROPLASTY Left 03/29/2016   Procedure: LEFT TOTAL KNEE ARTHROPLASTY;  Surgeon: Marybelle Killings, MD;  Location: [facility];  Service: Orthopedics;  Laterality: Left;   Social History   Occupational History  . retired    Social History Main Topics  . Smoking status: Former Smoker    Packs/day: 1.00    Years: 20.00    Types: Cigarettes    Quit date: 59  . Smokeless tobacco: Never Used  .  Alcohol use No  . Drug use: No  . Sexual activity: Yes    Partners: Female

## 2016-06-03 DIAGNOSIS — G4733 Obstructive sleep apnea (adult) (pediatric): Secondary | ICD-10-CM | POA: Diagnosis not present

## 2016-06-22 DIAGNOSIS — G4733 Obstructive sleep apnea (adult) (pediatric): Secondary | ICD-10-CM | POA: Diagnosis not present

## 2016-06-26 ENCOUNTER — Telehealth (INDEPENDENT_AMBULATORY_CARE_PROVIDER_SITE_OTHER): Payer: Self-pay | Admitting: Orthopaedic Surgery

## 2016-06-26 NOTE — Telephone Encounter (Signed)
Patient called advised his left knee pops when he walk and if he touch it. Patient said he  Is also having pain in the knee. Patient asked is this normal after surgery and does he need to make an appointment to see Dr Lorin Mercy. Patient said he mentioned it the last time he was in the office. The number to contact patient is [phone]

## 2016-06-26 NOTE — Telephone Encounter (Signed)
Please advise. Do you want patient to make an appointment?

## 2016-06-27 NOTE — Telephone Encounter (Signed)
I called discussed. Did not go to PT, he will go to gym get membership and do excercises I told him about and ROV in one month.

## 2016-07-18 ENCOUNTER — Telehealth (INDEPENDENT_AMBULATORY_CARE_PROVIDER_SITE_OTHER): Payer: Self-pay | Admitting: Radiology

## 2016-07-18 NOTE — Telephone Encounter (Signed)
Received call from patient's dentist regarding patient requiring pre med prior to dental work. Per Dr. Lorin Mercy, no pre med needed. Note faxed to patient's dentist at [phone].

## 2016-07-26 ENCOUNTER — Encounter (INDEPENDENT_AMBULATORY_CARE_PROVIDER_SITE_OTHER): Payer: Self-pay | Admitting: Orthopaedic Surgery

## 2016-07-26 ENCOUNTER — Ambulatory Visit (INDEPENDENT_AMBULATORY_CARE_PROVIDER_SITE_OTHER): Payer: PPO | Admitting: Orthopaedic Surgery

## 2016-07-26 VITALS — Ht 69.0 in | Wt 245.0 lb

## 2016-07-26 DIAGNOSIS — M6752 Plica syndrome, left knee: Secondary | ICD-10-CM

## 2016-07-26 NOTE — Progress Notes (Signed)
Office Visit Note   Patient: Jesse Ali           Date of Birth: December 07, 1950           MRN: [id number] Visit Date: 07/26/2016              Requested by: Ronita Hipps, MD [address] [place] [address], PCP: Ronita Hipps, MD   Assessment & Plan: Visit Diagnoses:  1. Plica syndrome, left knee      Suprapatellar region and a postop total knee arthroplasty 03/29/2016  Plan: The suprapatellar plica which is a variant of patellar clunk syndrome is not painful. Does not restrict his activities. We'll recheck him in 3 months. He likely has some hypertrophic synovium or might have some PVNS localized but has not had problems with hemarthrosis. He'll continue with quadriceps strengthening exercises and return in 3 months. He wants to proceed with right total knee arthroplasty in December of this year.  Follow-Up Instructions: Return in about 3 months (around 10/25/2016).   Orders:  No orders of the defined types were placed in this encounter.  No orders of the defined types were placed in this encounter.     Procedures: No procedures performed   Clinical Data: No additional findings.   Subjective: Chief Complaint  Patient presents with  . Left Knee - Routine Post Op, Follow-up    HPI patient returns post total knee arthroplasty left on 03/29/2016. He goes upstairs easily but has some problems when he goes downstairs. He's had some popping which is able to reproduce in the suprapatellar pouch which is about 2 cm proximal to the superior pole of the patella and is able to reproduce this with flexion. It occurs at the 70-90 of flexion but is not painful. He states it made gotten a little bit better in the last few months but it never hurts when pops.  Review of Systems vertebral via systems updated and unchanged as it pertains as history of present illness. Still has problems with his opposite right knee but once to wait to wintertime since he does some yard work for  several different yards and December is his slow season.   Objective: Vital Signs: Ht 5\' 9"  (1.753 m)   Wt 245 lb (111.1 kg)   BMI 36.18 kg/m   Physical Exam  Constitutional: He is oriented to person, place, and time. He appears well-developed and well-nourished.  HENT:  Head: Normocephalic and atraumatic.  Eyes: EOM are normal. Pupils are equal, round, and reactive to light.  Neck: No tracheal deviation present. No thyromegaly present.  Cardiovascular: Normal rate.   Pulmonary/Chest: Effort normal. He has no wheezes.  Abdominal: Soft. Bowel sounds are normal.  Musculoskeletal:  Patient is no family hip range of motion. There is palpable clunk to 3 cm proximal to superior pole of patella that occurs with flexion actively. Passively it does not occur. No medial plica patellar tracking is normal. He got up the step has more problems when he walks down. He takes good hand resistance can squat testing and has 115 flexion and full extension actively and passively. Distal pulses are intact negative popped of compression test.  Neurological: He is alert and oriented to person, place, and time.  Skin: Skin is warm and dry. Capillary refill takes less than 2 seconds.  Psychiatric: He has a normal mood and affect. His behavior is normal. Judgment and thought content normal.    Ortho Exam  Specialty Comments:  No specialty comments  available.  Imaging: No results found.   PMFS History: Patient Active Problem List   Diagnosis Date Noted  . 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 Arthritis of left knee 03/29/2016  . Bilateral primary osteoarthritis of knee 02/08/2016   Past Medical History:  Diagnosis Date  . Arthritis    fingers  . CKD (chronic kidney disease), stage III   . Hyperlipidemia   . Hypertension   . Sleep apnea    wears CPAP   . Wears glasses   . Wears glasses     No family history on file.  Past Surgical History:  Procedure Laterality Date  . APPENDECTOMY      . COLONOSCOPY    . JOINT REPLACEMENT    . KNEE ARTHROSCOPY Right 1998  . KNEE ARTHROSCOPY Left 1985  . SHOULDER SURGERY Right 2007   removal bone spurs  . TOTAL KNEE ARTHROPLASTY Left 03/29/2016   Procedure: LEFT TOTAL KNEE ARTHROPLASTY;  Surgeon: Marybelle Killings, MD;  Location: [facility];  Service: Orthopedics;  Laterality: Left;   Social History   Occupational History  . retired    Social History Main Topics  . Smoking status: Former Smoker    Packs/day: 1.00    Years: 20.00    Types: Cigarettes    Quit date: 53  . Smokeless tobacco: Never Used  . Alcohol use No  . Drug use: No  . Sexual activity: Yes    Partners: Female

## 2016-09-29 ENCOUNTER — Ambulatory Visit (INDEPENDENT_AMBULATORY_CARE_PROVIDER_SITE_OTHER): Payer: PPO | Admitting: Orthopaedic Surgery

## 2016-09-29 ENCOUNTER — Encounter (INDEPENDENT_AMBULATORY_CARE_PROVIDER_SITE_OTHER): Payer: Self-pay | Admitting: Orthopaedic Surgery

## 2016-09-29 ENCOUNTER — Ambulatory Visit (INDEPENDENT_AMBULATORY_CARE_PROVIDER_SITE_OTHER): Payer: PPO

## 2016-09-29 DIAGNOSIS — M25562 Pain in left knee: Secondary | ICD-10-CM

## 2016-09-29 DIAGNOSIS — Z96652 Presence of left artificial knee joint: Secondary | ICD-10-CM | POA: Diagnosis not present

## 2016-09-29 DIAGNOSIS — M6752 Plica syndrome, left knee: Secondary | ICD-10-CM

## 2016-09-29 LAB — CBC WITH DIFFERENTIAL/PLATELET
BASOS ABS: 111 {cells}/uL (ref 0–200)
Basophils Relative: 1 %
EOS ABS: 222 {cells}/uL (ref 15–500)
Eosinophils Relative: 2 %
HCT: 41.1 % (ref 38.5–50.0)
Hemoglobin: 13.7 g/dL (ref 13.2–17.1)
LYMPHS PCT: 23 %
Lymphs Abs: 2553 cells/uL (ref 850–3900)
MCH: 30.4 pg (ref 27.0–33.0)
MCHC: 33.3 g/dL (ref 32.0–36.0)
MCV: 91.1 fL (ref 80.0–100.0)
MONO ABS: 1221 {cells}/uL — AB (ref 200–950)
MPV: 10.1 fL (ref 7.5–12.5)
Monocytes Relative: 11 %
NEUTROS PCT: 63 %
Neutro Abs: 6993 cells/uL (ref 1500–7800)
Platelets: 219 10*3/uL (ref 140–400)
RBC: 4.51 MIL/uL (ref 4.20–5.80)
RDW: 14.3 % (ref 11.0–15.0)
WBC: 11.1 10*3/uL — ABNORMAL HIGH (ref 3.8–10.8)

## 2016-09-29 NOTE — Progress Notes (Signed)
Office Visit Note   Patient: Jesse Ali           Date of Birth: [birth year]           MRN: [id number] Visit Date: 09/29/2016              Requested by: Ronita Hipps, MD [address] [place] [address], PCP: Ronita Hipps, MD   Assessment & Plan: Visit Diagnoses:  1. Acute pain of left knee     Plan: His x-rays now show some new loosening underneath the tibial tray which was not present in the immediate postop x-rays in the office when he had his staples removed at 2 weeks postop. We'll obtain the arthritis panel plus CBC and CRP. If these are all except for he can proceed with outpatient arthroscopy for oblique excision and debridement of the suprapatellar pouch for partial synovectomy for patellar clunk syndrome. We discussed pathophysiology of condition and treatment options. If he has lab abnormalities we discussed the main require further imaging including possible bone scan. Patient states is becoming more difficult to ambulate in the community and now spotting on a daily basis with aching pain and when it pops sometimes it feels like his knee will give way.  Follow-Up Instructions: No Follow-up on file.   Orders:  Orders Placed This Encounter  Procedures  . XR Knee 1-2 Views Left   No orders of the defined types were placed in this encounter.     Procedures: No procedures performed   Clinical Data: No additional findings.   Subjective: Chief Complaint  Patient presents with  . Left Knee - Pain    HPI patient returns and is now 7 months post total knee arthroplasty. Starting about a month after surgery started having patellar clunk syndrome. Recently has become more symptomatic. He denies fever chills no history of illness or pneumonia and no antibiotics the last 6 months. He states with extension of his knee particularly against resistance he had a prominent clunk and the has a palpable nodule in the suprapatellar pouch that can be felt as well as  visualized with resisted extension with the clock. States his knee has been more painful last 2 months then prior to that. He points to the medial femoral condyle region where he has a palpable nodule that extends in the suprapatellar pouch consistent with medial plate, associated with the patellar clunk.  Review of Systems 14 point review of systems updated from last office visit and is unchanged other than mentioned in history of present illness.   Objective: Vital Signs: BP (!) 154/84   Pulse 63   Ht 5\' 9"  (1.753 m)   Wt 247 lb (112 kg)   BMI 36.48 kg/m   Physical Exam  Constitutional: He is oriented to person, place, and time. He appears well-developed and well-nourished.  HENT:  Head: Normocephalic and atraumatic.  Eyes: EOM are normal. Pupils are equal, round, and reactive to light.  Neck: No tracheal deviation present. No thyromegaly present.  Cardiovascular: Normal rate.   Pulmonary/Chest: Effort normal. He has no wheezes.  Abdominal: Soft. Bowel sounds are normal.  Musculoskeletal:  Patient has no pain with hip range of motion. Popliteal medial plica left knee collateral ligaments are stable. Acceptable drawer test is noted collateral ligaments in flexion are good. With active extension and flexion as the prone patellar clunk with palpable nodule. Distal pulses are intact no calf tenderness.  Neurological: He is alert and oriented to person, place, and  time.  Skin: Skin is warm and dry. Capillary refill takes less than 2 seconds.  Psychiatric: He has a normal mood and affect. His behavior is normal. Judgment and thought content normal.    Ortho Exam  Specialty Comments:  No specialty comments available.  Imaging: No results found.   PMFS History: Patient Active Problem List   Diagnosis Date Noted  . 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 Arthritis of left knee 03/29/2016  . Bilateral primary osteoarthritis of knee 02/08/2016   Past Medical History:  Diagnosis  Date  . Arthritis    fingers  . CKD (chronic kidney disease), stage III   . Hyperlipidemia   . Hypertension   . Sleep apnea    wears CPAP   . Wears glasses   . Wears glasses     No family history on file.  Past Surgical History:  Procedure Laterality Date  . APPENDECTOMY    . COLONOSCOPY    . JOINT REPLACEMENT    . KNEE ARTHROSCOPY Right 1998  . KNEE ARTHROSCOPY Left 1985  . SHOULDER SURGERY Right 2007   removal bone spurs  . TOTAL KNEE ARTHROPLASTY Left 03/29/2016   Procedure: LEFT TOTAL KNEE ARTHROPLASTY;  Surgeon: Marybelle Killings, MD;  Location: [facility];  Service: Orthopedics;  Laterality: Left;   Social History   Occupational History  . retired    Social History Main Topics  . Smoking status: Former Smoker    Packs/day: 1.00    Years: 20.00    Types: Cigarettes    Quit date: 3  . Smokeless tobacco: Never Used  . Alcohol use No  . Drug use: No  . Sexual activity: Yes    Partners: Female

## 2016-09-29 NOTE — Addendum Note (Signed)
Addended by: Meyer Cory on: 09/29/2016 03:35 PM   Modules accepted: Orders

## 2016-09-30 LAB — URIC ACID: URIC ACID, SERUM: 5.9 mg/dL (ref 4.0–8.0)

## 2016-09-30 LAB — RHEUMATOID FACTOR: Rhuematoid fact SerPl-aCnc: <14 IU/mL (ref <14)

## 2016-09-30 LAB — SEDIMENTATION RATE: Sed Rate: 4 mm/hr (ref 0–20)

## 2016-09-30 LAB — C-REACTIVE PROTEIN: CRP: 1.9 mg/L (ref <8.0)

## 2016-10-02 LAB — ANA: ANA: NEGATIVE

## 2016-10-03 DIAGNOSIS — G4733 Obstructive sleep apnea (adult) (pediatric): Secondary | ICD-10-CM | POA: Diagnosis not present

## 2016-10-04 ENCOUNTER — Telehealth (INDEPENDENT_AMBULATORY_CARE_PROVIDER_SITE_OTHER): Payer: Self-pay | Admitting: Orthopaedic Surgery

## 2016-10-04 NOTE — Telephone Encounter (Signed)
Patient called asking if the lab results could be sent over to his PCP for their records. CB # [phone]

## 2016-10-04 NOTE — Telephone Encounter (Signed)
Patient's PCP is Dr. Helene Kelp at [facility] in [place].

## 2016-10-06 NOTE — Telephone Encounter (Signed)
Faxed to office at [phone].

## 2016-10-17 ENCOUNTER — Ambulatory Visit (INDEPENDENT_AMBULATORY_CARE_PROVIDER_SITE_OTHER): Payer: PPO | Admitting: Orthopaedic Surgery

## 2016-10-27 DIAGNOSIS — M67262 Synovial hypertrophy, not elsewhere classified, left lower leg: Secondary | ICD-10-CM | POA: Diagnosis not present

## 2016-10-27 DIAGNOSIS — M222X1 Patellofemoral disorders, right knee: Secondary | ICD-10-CM | POA: Diagnosis not present

## 2016-10-27 DIAGNOSIS — M6752 Plica syndrome, left knee: Secondary | ICD-10-CM | POA: Diagnosis not present

## 2016-10-27 DIAGNOSIS — Z96651 Presence of right artificial knee joint: Secondary | ICD-10-CM | POA: Diagnosis not present

## 2016-10-27 DIAGNOSIS — M25862 Other specified joint disorders, left knee: Secondary | ICD-10-CM | POA: Diagnosis not present

## 2016-10-31 ENCOUNTER — Inpatient Hospital Stay (INDEPENDENT_AMBULATORY_CARE_PROVIDER_SITE_OTHER): Payer: PPO | Admitting: Orthopaedic Surgery

## 2016-11-01 ENCOUNTER — Ambulatory Visit (INDEPENDENT_AMBULATORY_CARE_PROVIDER_SITE_OTHER): Payer: PPO | Admitting: Orthopaedic Surgery

## 2016-11-07 ENCOUNTER — Encounter (INDEPENDENT_AMBULATORY_CARE_PROVIDER_SITE_OTHER): Payer: Self-pay | Admitting: Orthopaedic Surgery

## 2016-11-07 ENCOUNTER — Ambulatory Visit (INDEPENDENT_AMBULATORY_CARE_PROVIDER_SITE_OTHER): Payer: PPO | Admitting: Orthopaedic Surgery

## 2016-11-07 VITALS — Ht 69.0 in | Wt 247.0 lb

## 2016-11-07 DIAGNOSIS — M25862 Other specified joint disorders, left knee: Secondary | ICD-10-CM

## 2016-11-07 NOTE — Progress Notes (Signed)
   Post-Op Visit Note   Patient: Jesse Ali           Date of Birth: 1951/02/15           MRN: [id number] Visit Date: 11/07/2016 PCP: Ronita Hipps, MD   Assessment & Plan: Postop partial synovectomy for patellar clunk syndrome postop total knee arthroplasty. He had a large mass of water scar tissue debrided. He no longer has any clunking he states his pain is significantly better he's walking better.  Chief Complaint:  Chief Complaint  Patient presents with  . Left Knee - Routine Post Op   Visit Diagnoses:  1. Patellar clunk syndrome of left knee     Plan: Patient gradually resume normal work activity and work on increasing his walking work on losing low but a white continue strengthening on his left knee will call PSA problems.  Follow-Up Instructions: No Follow-up on file.   Orders:  No orders of the defined types were placed in this encounter.  No orders of the defined types were placed in this encounter.   Imaging: No results found.  PMFS History: Patient Active Problem List   Diagnosis Date Noted  . 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 Arthritis of left knee 03/29/2016  . Bilateral primary osteoarthritis of knee 02/08/2016   Past Medical History:  Diagnosis Date  . Arthritis    fingers  . CKD (chronic kidney disease), stage III   . Hyperlipidemia   . Hypertension   . Sleep apnea    wears CPAP   . Wears glasses   . Wears glasses     No family history on file.  Past Surgical History:  Procedure Laterality Date  . APPENDECTOMY    . COLONOSCOPY    . JOINT REPLACEMENT    . KNEE ARTHROSCOPY Right 1998  . KNEE ARTHROSCOPY Left 1985  . SHOULDER SURGERY Right 2007   removal bone spurs  . TOTAL KNEE ARTHROPLASTY Left 03/29/2016   Procedure: LEFT TOTAL KNEE ARTHROPLASTY;  Surgeon: Marybelle Killings, MD;  Location: [facility];  Service: Orthopedics;  Laterality: Left;   Social History   Occupational History  . retired    Social History Main Topics  . Smoking  status: Former Smoker    Packs/day: 1.00    Years: 20.00    Types: Cigarettes    Quit date: 74  . Smokeless tobacco: Never Used  . Alcohol use No  . Drug use: No  . Sexual activity: Yes    Partners: Female

## 2016-12-05 ENCOUNTER — Encounter (INDEPENDENT_AMBULATORY_CARE_PROVIDER_SITE_OTHER): Payer: Self-pay | Admitting: Orthopaedic Surgery

## 2016-12-05 ENCOUNTER — Ambulatory Visit (INDEPENDENT_AMBULATORY_CARE_PROVIDER_SITE_OTHER): Payer: PPO | Admitting: Orthopaedic Surgery

## 2016-12-05 ENCOUNTER — Ambulatory Visit (HOSPITAL_COMMUNITY)
Admission: RE | Admit: 2016-12-05 | Discharge: 2016-12-05 | Disposition: A | Discharge status: Home / Self Care | Payer: PPO | Source: Ambulatory Visit | Attending: Orthopaedic Surgery | Admitting: Orthopaedic Surgery

## 2016-12-05 VITALS — BP 137/85 | HR 57 | Ht 69.0 in | Wt 247.0 lb

## 2016-12-05 DIAGNOSIS — M7989 Other specified soft tissue disorders: Secondary | ICD-10-CM | POA: Insufficient documentation

## 2016-12-05 DIAGNOSIS — M79605 Pain in left leg: Secondary | ICD-10-CM

## 2016-12-05 NOTE — Progress Notes (Signed)
*  PRELIMINARY RESULTS* Vascular Ultrasound Left lower extremity venous duplex has been completed.  Preliminary findings: No evidence of deep vein thrombosis in the visualized veins of the left lower extremity.  Heterogeneous area extending from popliteal fossa to mid calf along the medial aspect, ruptured baker's cyst vs hematoma?  Preliminary results called to Dr. Lorin Mercy @ 15:05. Patient will follow up next week if pain continues.   Jesse Ali 12/05/2016, 3:04 PM

## 2016-12-05 NOTE — Progress Notes (Signed)
   Office Visit Note   Patient: Jesse Ali           Date of Birth: 1951-01-12           MRN: [id number] Visit Date: 12/05/2016              Requested by: Ronita Hipps, MD [address] [place] [address], PCP: Ronita Hipps, MD   Assessment & Plan: Visit Diagnoses: Postop synovectomy with patellar clunk syndrome 10/27/2016.  Plan: We'll send him for a Doppler disease likely has DVT. It does not involve the upper thigh veins or pelvis then we will place him on Xarelto. Doppler tests ordered and is scheduled at 2:00 today  Follow-Up Instructions: No Follow-up on file.   Orders:  No orders of the defined types were placed in this encounter.  No orders of the defined types were placed in this encounter.     Procedures: No procedures performed   Clinical Data: No additional findings.   Subjective: Chief Complaint  Patient presents with  . Left Knee - Pain    HPI patient is postop now one month out and started having swelling about a week ago has tense swelling in his posterior calf with soreness. No acute muscle strain no falls. No history of DVT.  Review of Systems review of systems updated from surgery and is unchanged.   Objective: Vital Signs: BP 137/85   Pulse (!) 57   Ht 5\' 9"  (1.753 m)   Wt 247 lb (112 kg)   BMI 36.48 kg/m   Physical Exam patient has swelling in the left lower extremity from the knee down. Positive Homans test. He was placed on one aspirin a day but on his own cut back to baby aspirin. Negative straight-leg raising.  Ortho Exam  Specialty Comments:  No specialty comments available.  Imaging: No results found.   PMFS History: Patient Active Problem List   Diagnosis Date Noted  . 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 Arthritis of left knee 03/29/2016  . Bilateral primary osteoarthritis of knee 02/08/2016   Past Medical History:  Diagnosis Date  . Arthritis    fingers  . CKD (chronic kidney disease), stage III   . Hyperlipidemia   . Hypertension   . Sleep apnea    wears CPAP   . Wears glasses   . Wears glasses     No family history on file.  Past Surgical History:  Procedure Laterality Date  . APPENDECTOMY    . COLONOSCOPY    . JOINT REPLACEMENT    . KNEE ARTHROSCOPY Right 1998  . KNEE ARTHROSCOPY Left 1985  . SHOULDER SURGERY Right 2007   removal bone spurs  . TOTAL KNEE ARTHROPLASTY Left 03/29/2016   Procedure: LEFT TOTAL KNEE ARTHROPLASTY;  Surgeon: Marybelle Killings, MD;  Location: [facility];  Service: Orthopedics;  Laterality: Left;   Social History   Occupational History  . retired    Social History Main Topics  . Smoking status: Former Smoker    Packs/day: 1.00    Years: 20.00    Types: Cigarettes    Quit date: 37  . Smokeless tobacco: Never Used  . Alcohol use No  . Drug use: No  . Sexual activity: Yes    Partners: Female

## 2016-12-07 ENCOUNTER — Telehealth (INDEPENDENT_AMBULATORY_CARE_PROVIDER_SITE_OTHER): Payer: Self-pay | Admitting: Orthopaedic Surgery

## 2016-12-07 NOTE — Telephone Encounter (Signed)
Ucall. He can use NSAIDs OTC and elevation. Cyst is more than 3 inches and he can make appt to have me try to drain it if he wants.

## 2016-12-07 NOTE — Telephone Encounter (Signed)
I called patient and advised. He is going to try the medication and elevation and then call me next week if he would like for me to work in to the schedule to have cyst drained.

## 2016-12-07 NOTE — Telephone Encounter (Signed)
Patient was seen at the hospital earlier this week for his knee and they told him he has a bakers cyst. He was wanting to know if it could be taken care of with medication or if he'd have to make an appointment to get it drained. CB # [phone]

## 2016-12-07 NOTE — Telephone Encounter (Signed)
Please advise 

## 2017-01-09 DIAGNOSIS — G4733 Obstructive sleep apnea (adult) (pediatric): Secondary | ICD-10-CM | POA: Diagnosis not present

## 2017-02-27 DIAGNOSIS — C441191 Basal cell carcinoma of skin of left upper eyelid, including canthus: Secondary | ICD-10-CM | POA: Diagnosis not present

## 2017-02-27 DIAGNOSIS — L578 Other skin changes due to chronic exposure to nonionizing radiation: Secondary | ICD-10-CM | POA: Diagnosis not present

## 2017-02-27 DIAGNOSIS — L820 Inflamed seborrheic keratosis: Secondary | ICD-10-CM | POA: Diagnosis not present

## 2017-03-28 DIAGNOSIS — C441191 Basal cell carcinoma of skin of left upper eyelid, including canthus: Secondary | ICD-10-CM | POA: Diagnosis not present

## 2017-04-18 DIAGNOSIS — G4733 Obstructive sleep apnea (adult) (pediatric): Secondary | ICD-10-CM | POA: Diagnosis not present

## 2017-04-18 DIAGNOSIS — G4752 REM sleep behavior disorder: Secondary | ICD-10-CM | POA: Diagnosis not present

## 2017-04-19 DIAGNOSIS — N183 Chronic kidney disease, stage 3 (moderate): Secondary | ICD-10-CM | POA: Diagnosis not present

## 2017-04-19 DIAGNOSIS — I10 Essential (primary) hypertension: Secondary | ICD-10-CM | POA: Diagnosis not present

## 2017-04-19 DIAGNOSIS — Z23 Encounter for immunization: Secondary | ICD-10-CM | POA: Diagnosis not present

## 2017-04-19 DIAGNOSIS — Z1331 Encounter for screening for depression: Secondary | ICD-10-CM | POA: Diagnosis not present

## 2017-04-19 DIAGNOSIS — G4730 Sleep apnea, unspecified: Secondary | ICD-10-CM | POA: Diagnosis not present

## 2017-04-19 DIAGNOSIS — E785 Hyperlipidemia, unspecified: Secondary | ICD-10-CM | POA: Diagnosis not present

## 2017-04-19 DIAGNOSIS — Z1339 Encounter for screening examination for other mental health and behavioral disorders: Secondary | ICD-10-CM | POA: Diagnosis not present

## 2017-04-19 DIAGNOSIS — Z79899 Other long term (current) drug therapy: Secondary | ICD-10-CM | POA: Diagnosis not present

## 2017-04-19 DIAGNOSIS — J309 Allergic rhinitis, unspecified: Secondary | ICD-10-CM | POA: Diagnosis not present

## 2017-04-19 DIAGNOSIS — Z0000 Encounter for general adult medical examination without abnormal findings: Secondary | ICD-10-CM | POA: Diagnosis not present

## 2017-04-19 DIAGNOSIS — Z6838 Body mass index (BMI) 38.0-38.9, adult: Secondary | ICD-10-CM | POA: Diagnosis not present

## 2017-04-20 DIAGNOSIS — G4733 Obstructive sleep apnea (adult) (pediatric): Secondary | ICD-10-CM | POA: Diagnosis not present

## 2017-04-24 DIAGNOSIS — H524 Presbyopia: Secondary | ICD-10-CM | POA: Diagnosis not present

## 2017-04-24 DIAGNOSIS — H5203 Hypermetropia, bilateral: Secondary | ICD-10-CM | POA: Diagnosis not present

## 2017-04-26 DIAGNOSIS — G4733 Obstructive sleep apnea (adult) (pediatric): Secondary | ICD-10-CM | POA: Diagnosis not present

## 2017-05-03 DIAGNOSIS — G4752 REM sleep behavior disorder: Secondary | ICD-10-CM | POA: Diagnosis not present

## 2017-05-03 DIAGNOSIS — G4733 Obstructive sleep apnea (adult) (pediatric): Secondary | ICD-10-CM | POA: Diagnosis not present

## 2017-06-14 DIAGNOSIS — G4752 REM sleep behavior disorder: Secondary | ICD-10-CM | POA: Diagnosis not present

## 2017-06-14 DIAGNOSIS — G4733 Obstructive sleep apnea (adult) (pediatric): Secondary | ICD-10-CM | POA: Diagnosis not present

## 2017-07-10 DIAGNOSIS — G4733 Obstructive sleep apnea (adult) (pediatric): Secondary | ICD-10-CM | POA: Diagnosis not present

## 2017-07-24 DIAGNOSIS — G4752 REM sleep behavior disorder: Secondary | ICD-10-CM | POA: Diagnosis not present

## 2017-07-24 DIAGNOSIS — J301 Allergic rhinitis due to pollen: Secondary | ICD-10-CM | POA: Diagnosis not present

## 2017-07-24 DIAGNOSIS — G4733 Obstructive sleep apnea (adult) (pediatric): Secondary | ICD-10-CM | POA: Diagnosis not present

## 2017-08-09 DIAGNOSIS — I10 Essential (primary) hypertension: Secondary | ICD-10-CM | POA: Diagnosis not present

## 2017-08-28 DIAGNOSIS — Z96652 Presence of left artificial knee joint: Secondary | ICD-10-CM | POA: Diagnosis not present

## 2017-08-28 DIAGNOSIS — G8929 Other chronic pain: Secondary | ICD-10-CM | POA: Diagnosis not present

## 2017-08-28 DIAGNOSIS — M1711 Unilateral primary osteoarthritis, right knee: Secondary | ICD-10-CM | POA: Diagnosis not present

## 2017-08-28 DIAGNOSIS — M25561 Pain in right knee: Secondary | ICD-10-CM | POA: Diagnosis not present

## 2017-09-04 DIAGNOSIS — Z6837 Body mass index (BMI) 37.0-37.9, adult: Secondary | ICD-10-CM | POA: Diagnosis not present

## 2017-09-04 DIAGNOSIS — Z01818 Encounter for other preprocedural examination: Secondary | ICD-10-CM | POA: Diagnosis not present

## 2017-09-13 ENCOUNTER — Other Ambulatory Visit: Payer: Self-pay | Admitting: Orthopedic Surgery

## 2017-10-12 DIAGNOSIS — G4733 Obstructive sleep apnea (adult) (pediatric): Secondary | ICD-10-CM | POA: Diagnosis not present

## 2017-10-15 NOTE — Pre-Procedure Instructions (Signed)
[facility]  10/15/2017      [place], [place], [place] [address] Phone: [phone] Fax: [phone]    Your procedure is scheduled on Monday July 15.  Report to [facility] Admitting at 6:15 A.M.  Call this number if you have problems the morning of surgery:  [phone]   Remember:  Do not eat or drink after midnight.    Take these medicines the morning of surgery with A SIP OF WATER: NONE  7 days prior to surgery STOP taking any Aleve, Naproxen, Ibuprofen, Motrin, Advil, Goody's, BC's, all herbal medications, fish oil, and all vitamins  **FOLLOW your surgeon's instructions on stopping Aspirin**    Do not wear jewelry  Do not wear lotions, powders, or colognes, or deodorant.  Do not shave 48 hours prior to surgery.  Men may shave face and neck.  Do not bring valuables to the hospital.  [facility] is not responsible for any belongings or valuables.  Contacts, dentures or bridgework may not be worn into surgery.  Leave your suitcase in the car.  After surgery it may be brought to your room.  For patients admitted to the hospital, discharge time will be determined by your treatment team.  Patients discharged the day of surgery will not be allowed to drive home.   Special instructions:    [facility]- Preparing For Surgery  Before surgery, you can play an important role. Because skin is not sterile, your skin needs to be as free of germs as possible. You can reduce the number of germs on your skin by washing with CHG (chlorahexidine gluconate) Soap before surgery.  CHG is an antiseptic cleaner which kills germs and bonds with the skin to continue killing germs even after washing.    Oral Hygiene is also important to reduce your risk of infection.  Remember - BRUSH YOUR TEETH THE MORNING OF SURGERY WITH YOUR REGULAR TOOTHPASTE  Please do not use if you have an allergy to CHG or antibacterial  soaps. If your skin becomes reddened/irritated stop using the CHG.  Do not shave (including legs and underarms) for at least 48 hours prior to first CHG shower. It is OK to shave your face.  Please follow these instructions carefully.   1. Shower the NIGHT BEFORE SURGERY and the MORNING OF SURGERY with CHG.   2. If you chose to wash your hair, wash your hair first as usual with your normal shampoo.  3. After you shampoo, rinse your hair and body thoroughly to remove the shampoo.  4. Use CHG as you would any other liquid soap. You can apply CHG directly to the skin and wash gently with a scrungie or a clean washcloth.   5. Apply the CHG Soap to your body ONLY FROM THE NECK DOWN.  Do not use on open wounds or open sores. Avoid contact with your eyes, ears, mouth and genitals (private parts). Wash Face and genitals (private parts)  with your normal soap.  6. Wash thoroughly, paying special attention to the area where your surgery will be performed.  7. Thoroughly rinse your body with warm water from the neck down.  8. DO NOT shower/wash with your normal soap after using and rinsing off the CHG Soap.  9. Pat yourself dry with a CLEAN TOWEL.  10. Wear CLEAN PAJAMAS to bed the night before surgery, wear comfortable clothes the  morning of surgery  11. Place CLEAN SHEETS on your bed the night of your first shower and DO NOT SLEEP WITH PETS.    Day of Surgery:  Do not apply any deodorants/lotions.  Please wear clean clothes to the hospital/surgery center.   Remember to brush your teeth WITH YOUR REGULAR TOOTHPASTE.    Please read over the following fact sheets that you were given. Coughing and Deep Breathing, Total Joint Packet, MRSA Information and Surgical Site Infection Prevention

## 2017-10-16 ENCOUNTER — Other Ambulatory Visit: Payer: Self-pay

## 2017-10-16 ENCOUNTER — Encounter (HOSPITAL_COMMUNITY)
Admission: RE | Admit: 2017-10-16 | Discharge: 2017-10-16 | Disposition: A | Discharge status: Home / Self Care | Payer: PPO | Source: Ambulatory Visit | Attending: Orthopedic Surgery | Admitting: Orthopedic Surgery

## 2017-10-16 ENCOUNTER — Encounter (HOSPITAL_COMMUNITY): Payer: Self-pay

## 2017-10-16 DIAGNOSIS — Z01818 Encounter for other preprocedural examination: Secondary | ICD-10-CM | POA: Insufficient documentation

## 2017-10-16 DIAGNOSIS — M1711 Unilateral primary osteoarthritis, right knee: Secondary | ICD-10-CM | POA: Diagnosis not present

## 2017-10-16 LAB — CBC WITH DIFFERENTIAL/PLATELET
Abs Immature Granulocytes: 0 10*3/uL (ref 0.0–0.1)
BASOS ABS: 0.1 10*3/uL (ref 0.0–0.1)
Basophils Relative: 1 %
EOS ABS: 0.3 10*3/uL (ref 0.0–0.7)
EOS PCT: 4 %
HCT: 46 % (ref 39.0–52.0)
HEMOGLOBIN: 15 g/dL (ref 13.0–17.0)
Immature Granulocytes: 0 %
LYMPHS PCT: 28 %
Lymphs Abs: 2 10*3/uL (ref 0.7–4.0)
MCH: 30.4 pg (ref 26.0–34.0)
MCHC: 32.6 g/dL (ref 30.0–36.0)
MCV: 93.3 fL (ref 78.0–100.0)
MONO ABS: 0.9 10*3/uL (ref 0.1–1.0)
Monocytes Relative: 12 %
Neutro Abs: 4 10*3/uL (ref 1.7–7.7)
Neutrophils Relative %: 55 %
Platelets: 201 10*3/uL (ref 150–400)
RBC: 4.93 MIL/uL (ref 4.22–5.81)
RDW: 13.1 % (ref 11.5–15.5)
WBC: 7.3 10*3/uL (ref 4.0–10.5)

## 2017-10-16 LAB — COMPREHENSIVE METABOLIC PANEL
ALBUMIN: 4.3 g/dL (ref 3.5–5.0)
ALK PHOS: 83 U/L (ref 38–126)
ALT: 23 U/L (ref 0–44)
ANION GAP: 9 (ref 5–15)
AST: 21 U/L (ref 15–41)
BILIRUBIN TOTAL: 0.9 mg/dL (ref 0.3–1.2)
BUN: 21 mg/dL (ref 8–23)
CALCIUM: 9.6 mg/dL (ref 8.9–10.3)
CO2: 23 mmol/L (ref 22–32)
Chloride: 106 mmol/L (ref 98–111)
Creatinine, Ser: 1.51 mg/dL — ABNORMAL HIGH (ref 0.61–1.24)
GFR calc Af Amer: 53 mL/min — ABNORMAL LOW (ref >60)
GFR calc non Af Amer: 46 mL/min — ABNORMAL LOW (ref >60)
Glucose, Bld: 96 mg/dL (ref 70–99)
POTASSIUM: 4 mmol/L (ref 3.5–5.1)
Sodium: 138 mmol/L (ref 135–145)
TOTAL PROTEIN: 7.4 g/dL (ref 6.5–8.1)

## 2017-10-16 LAB — SURGICAL PCR SCREEN
MRSA, PCR: NEGATIVE
STAPHYLOCOCCUS AUREUS: NEGATIVE

## 2017-10-16 NOTE — Progress Notes (Signed)
PCP - Kennith Maes Cardiologist - denies  Chest x-ray - not needed EKG - requesting Stress Test - denies ECHO - denies Cardiac Cath - denies  Sleep Study - requesting CPAP - instructed patient to bring mask and tubing the morning of surgery   Aspirin Instructions: stop 5-7 days  Anesthesia review: yes for requested records  Patient denies shortness of breath, fever, cough and chest pain at PAT appointment   Patient verbalized understanding of instructions that were given to them at the PAT appointment. Patient was also instructed that they will need to review over the PAT instructions again at home before surgery.

## 2017-10-17 NOTE — Progress Notes (Signed)
Anesthesia Chart Review:   Case:  [id number] Date/Time:  10/29/17 0800   Procedure:  RIGHT TOTAL KNEE ARTHROPLASTY (Right Knee)   Anesthesia type:  Spinal   Pre-op diagnosis:  primary osteoarthritis right knee   Location:  [facility] OR ROOM 07 / [facility] OR   Surgeon:  Vickey Huger, MD      DISCUSSION: - Pt is a 67 year old male with hx HTN, OSA, CKD (stage II-III)  VS: BP (!) 174/87   Pulse (!) 58   Temp 36.5 C   Resp 20   Ht 5\' 9"  (1.753 m)   Wt 258 lb 12.8 oz (117.4 kg)   SpO2 99%   BMI 38.22 kg/m   PROVIDERS: PCP is Ronita Hipps, MD who cleared pt for surgery at last office visit 09/04/17   LABS: Labs reviewed: Acceptable for surgery. (all labs ordered are listed, but only abnormal results are displayed)  Labs Reviewed  COMPREHENSIVE METABOLIC PANEL - Abnormal; Notable for the following components:      Result Value   Creatinine, Ser 1.51 (*)    GFR calc non Af Amer 46 (*)    GFR calc Af Amer 53 (*)    All other components within normal limits  SURGICAL PCR SCREEN  CBC WITH DIFFERENTIAL/PLATELET    EKG 09/04/17 (PCP's office): sinus bradycardia (57 bpm) with sinus arrhythmia. Inferior infarct, age undetermined. Anterior T wave changes are nonspecific - Pt cleared for surgery by PCP at office visit where this EKG was obtained.    Past Medical History:  Diagnosis Date  . Arthritis    fingers  . CKD (chronic kidney disease), stage III ([place])    patient denies  . Hyperlipidemia   . Hypertension   . Sleep apnea    wears CPAP   . Wears glasses   . Wears glasses     Past Surgical History:  Procedure Laterality Date  . APPENDECTOMY    . COLONOSCOPY    . JOINT REPLACEMENT    . KNEE ARTHROSCOPY Right 1998   2x  . KNEE ARTHROSCOPY Left 1985  . SHOULDER SURGERY Right 2007   removal bone spurs  . TOTAL KNEE ARTHROPLASTY Left 03/29/2016   Procedure: LEFT TOTAL KNEE ARTHROPLASTY;  Surgeon: Marybelle Killings, MD;  Location: [facility];  Service: Orthopedics;  Laterality: Left;     MEDICATIONS: . amLODipine (NORVASC) 2.5 MG tablet  . aspirin EC 81 MG tablet  . atorvastatin (LIPITOR) 10 MG tablet  . azelastine (ASTELIN) 0.1 % nasal spray  . fluticasone (FLONASE) 50 MCG/ACT nasal spray  . gabapentin (NEURONTIN) 100 MG capsule  . losartan (COZAAR) 100 MG tablet  . LYSINE PO  . naproxen sodium (ALEVE) 220 MG tablet   No current facility-administered medications for this encounter.     If no changes, I anticipate pt can proceed with surgery as scheduled.   Willeen Cass, FNP-BC [facility] Short Stay Surgical [facility]/Anesthesiology Phone: [phone] 10/17/2017 1:20 PM

## 2017-10-26 MED ORDER — BUPIVACAINE LIPOSOME 1.3 % IJ SUSP
20.0000 mL | Freq: Once | INTRAMUSCULAR | Status: DC
  Filled 2017-10-26: qty 20

## 2017-10-26 MED ORDER — TRANEXAMIC ACID 1000 MG/10ML IV SOLN
1000.0000 mg | INTRAVENOUS | Status: AC
  Administered 2017-10-29: 1000 mg via INTRAVENOUS
  Filled 2017-10-26: qty 1100

## 2017-10-28 NOTE — Anesthesia Preprocedure Evaluation (Addendum)
Anesthesia Evaluation  Patient identified by MRN, date of birth, ID band Patient awake    Reviewed: Allergy & Precautions, NPO status , Patient's Chart, lab work & pertinent test results  History of Anesthesia Complications Negative for: history of anesthetic complications  Airway Mallampati: II  TM Distance: >3 FB Neck ROM: Full    Dental  (+) Dental Advisory Given, Caps   Pulmonary sleep apnea and Continuous Positive Airway Pressure Ventilation , former smoker (quit 1985),    breath sounds clear to auscultation       Cardiovascular hypertension, Pt. on medications (-) angina Rhythm:Regular Rate:Normal     Neuro/Psych negative neurological ROS     GI/Hepatic negative GI ROS, Neg liver ROS,   Endo/Other  Morbid obesity  Renal/GU Renal InsufficiencyRenal disease (creat 1.51)     Musculoskeletal  (+) Arthritis , Osteoarthritis,    Abdominal (+) + obese,   Peds  Hematology negative hematology ROS (+)   Anesthesia Other Findings   Reproductive/Obstetrics                            Anesthesia Physical Anesthesia Plan  ASA: III  Anesthesia Plan: Spinal   Post-op Pain Management:  Regional for Post-op pain   Induction:   PONV Risk Score and Plan: 1 and Ondansetron  Airway Management Planned: Natural Airway and Simple Face Mask  Additional Equipment:   Intra-op Plan:   Post-operative Plan:   Informed Consent: I have reviewed the patients History and Physical, chart, labs and discussed the procedure including the risks, benefits and alternatives for the proposed anesthesia with the patient or authorized representative who has indicated his/her understanding and acceptance.   Dental advisory given  Plan Discussed with: CRNA and Surgeon  Anesthesia Plan Comments: (Plan routine monitors, SAB with adductor canal block for post op analgesia)        Anesthesia Quick  Evaluation

## 2017-10-29 ENCOUNTER — Ambulatory Visit (HOSPITAL_COMMUNITY): Payer: PPO | Admitting: Emergency Medicine

## 2017-10-29 ENCOUNTER — Ambulatory Visit (HOSPITAL_COMMUNITY): Payer: PPO | Admitting: Anesthesiology

## 2017-10-29 ENCOUNTER — Other Ambulatory Visit: Payer: Self-pay

## 2017-10-29 ENCOUNTER — Observation Stay (HOSPITAL_COMMUNITY)
Admission: RE | Admit: 2017-10-29 | Discharge: 2017-10-30 | Disposition: A | Discharge status: Home / Self Care | Payer: PPO | Source: Ambulatory Visit | Attending: Orthopedic Surgery | Admitting: Orthopedic Surgery

## 2017-10-29 ENCOUNTER — Encounter (HOSPITAL_COMMUNITY)
Admission: RE | Disposition: A | Discharge status: Home / Self Care | Payer: Self-pay | Source: Ambulatory Visit | Attending: Orthopedic Surgery

## 2017-10-29 ENCOUNTER — Encounter (HOSPITAL_COMMUNITY): Payer: Self-pay | Admitting: Certified Registered Nurse Anesthetist

## 2017-10-29 DIAGNOSIS — I129 Hypertensive chronic kidney disease with stage 1 through stage 4 chronic kidney disease, or unspecified chronic kidney disease: Secondary | ICD-10-CM | POA: Diagnosis not present

## 2017-10-29 DIAGNOSIS — Z7982 Long term (current) use of aspirin: Secondary | ICD-10-CM | POA: Insufficient documentation

## 2017-10-29 DIAGNOSIS — I10 Essential (primary) hypertension: Secondary | ICD-10-CM | POA: Diagnosis not present

## 2017-10-29 DIAGNOSIS — E1122 Type 2 diabetes mellitus with diabetic chronic kidney disease: Secondary | ICD-10-CM | POA: Diagnosis not present

## 2017-10-29 DIAGNOSIS — N183 Chronic kidney disease, stage 3 (moderate): Secondary | ICD-10-CM | POA: Diagnosis not present

## 2017-10-29 DIAGNOSIS — M1711 Unilateral primary osteoarthritis, right knee: Secondary | ICD-10-CM | POA: Diagnosis not present

## 2017-10-29 DIAGNOSIS — Z6836 Body mass index (BMI) 36.0-36.9, adult: Secondary | ICD-10-CM | POA: Insufficient documentation

## 2017-10-29 DIAGNOSIS — Z79899 Other long term (current) drug therapy: Secondary | ICD-10-CM | POA: Diagnosis not present

## 2017-10-29 DIAGNOSIS — Z96659 Presence of unspecified artificial knee joint: Secondary | ICD-10-CM

## 2017-10-29 DIAGNOSIS — M19049 Primary osteoarthritis, unspecified hand: Secondary | ICD-10-CM | POA: Insufficient documentation

## 2017-10-29 DIAGNOSIS — G4730 Sleep apnea, unspecified: Secondary | ICD-10-CM | POA: Diagnosis not present

## 2017-10-29 DIAGNOSIS — Z96652 Presence of left artificial knee joint: Secondary | ICD-10-CM | POA: Insufficient documentation

## 2017-10-29 DIAGNOSIS — E785 Hyperlipidemia, unspecified: Secondary | ICD-10-CM | POA: Diagnosis not present

## 2017-10-29 DIAGNOSIS — G8918 Other acute postprocedural pain: Secondary | ICD-10-CM | POA: Diagnosis not present

## 2017-10-29 HISTORY — PX: TOTAL KNEE ARTHROPLASTY: SHX125

## 2017-10-29 SURGERY — ARTHROPLASTY, KNEE, TOTAL
Anesthesia: Spinal | Site: Knee | Laterality: Right

## 2017-10-29 MED ORDER — DEXAMETHASONE SODIUM PHOSPHATE 10 MG/ML IJ SOLN
8.0000 mg | Freq: Once | INTRAMUSCULAR | Status: AC
  Administered 2017-10-29: 8 mg via INTRAVENOUS

## 2017-10-29 MED ORDER — CEFAZOLIN SODIUM-DEXTROSE 2-4 GM/100ML-% IV SOLN
2.0000 g | INTRAVENOUS | Status: AC
  Administered 2017-10-29: 2 g via INTRAVENOUS

## 2017-10-29 MED ORDER — ALUM & MAG HYDROXIDE-SIMETH 200-200-20 MG/5ML PO SUSP: 30.0000 mL | ORAL | Status: DC | PRN

## 2017-10-29 MED ORDER — DEXAMETHASONE SODIUM PHOSPHATE 10 MG/ML IJ SOLN
10.0000 mg | Freq: Once | INTRAMUSCULAR | Status: AC
  Administered 2017-10-30: 10 mg via INTRAVENOUS
  Filled 2017-10-29: qty 1

## 2017-10-29 MED ORDER — ZOLPIDEM TARTRATE 5 MG PO TABS: 5.0000 mg | ORAL_TABLET | Freq: Every evening | ORAL | Status: DC | PRN

## 2017-10-29 MED ORDER — GABAPENTIN 300 MG PO CAPS
300.0000 mg | ORAL_CAPSULE | Freq: Once | ORAL | Status: AC
  Administered 2017-10-29: 300 mg via ORAL

## 2017-10-29 MED ORDER — CHLORHEXIDINE GLUCONATE 4 % EX LIQD: 60.0000 mL | Freq: Once | CUTANEOUS | Status: DC

## 2017-10-29 MED ORDER — BUPIVACAINE IN DEXTROSE 0.75-8.25 % IT SOLN
INTRATHECAL | Status: DC | PRN
  Administered 2017-10-29: 15 mg via INTRATHECAL

## 2017-10-29 MED ORDER — ROPIVACAINE HCL 7.5 MG/ML IJ SOLN
INTRAMUSCULAR | Status: DC | PRN
  Administered 2017-10-29: 20 mL via PERINEURAL

## 2017-10-29 MED ORDER — PHENOL 1.4 % MT LIQD: 1.0000 | OROMUCOSAL | Status: DC | PRN

## 2017-10-29 MED ORDER — SODIUM CHLORIDE 0.9 % IR SOLN
Status: DC | PRN
  Administered 2017-10-29: 3000 mL

## 2017-10-29 MED ORDER — ONDANSETRON HCL 4 MG/2ML IJ SOLN: 4.0000 mg | Freq: Four times a day (QID) | INTRAMUSCULAR | Status: DC | PRN

## 2017-10-29 MED ORDER — CEFAZOLIN SODIUM-DEXTROSE 2-4 GM/100ML-% IV SOLN
2.0000 g | Freq: Four times a day (QID) | INTRAVENOUS | Status: AC
  Administered 2017-10-29 (×2): 2 g via INTRAVENOUS
  Filled 2017-10-29 (×2): qty 100

## 2017-10-29 MED ORDER — BISACODYL 5 MG PO TBEC: 5.0000 mg | DELAYED_RELEASE_TABLET | Freq: Every day | ORAL | Status: DC | PRN

## 2017-10-29 MED ORDER — CEFAZOLIN SODIUM-DEXTROSE 2-4 GM/100ML-% IV SOLN
INTRAVENOUS | Status: AC
  Filled 2017-10-29: qty 100

## 2017-10-29 MED ORDER — ONDANSETRON HCL 4 MG/2ML IJ SOLN
INTRAMUSCULAR | Status: DC | PRN
  Administered 2017-10-29: 4 mg via INTRAVENOUS

## 2017-10-29 MED ORDER — ACETAMINOPHEN 500 MG PO TABS
1000.0000 mg | ORAL_TABLET | Freq: Four times a day (QID) | ORAL | Status: AC
  Administered 2017-10-29 – 2017-10-30 (×4): 1000 mg via ORAL
  Filled 2017-10-29 (×4): qty 2

## 2017-10-29 MED ORDER — GABAPENTIN 300 MG PO CAPS
300.0000 mg | ORAL_CAPSULE | Freq: Three times a day (TID) | ORAL | Status: DC
  Administered 2017-10-29 – 2017-10-30 (×3): 300 mg via ORAL
  Filled 2017-10-29 (×3): qty 1

## 2017-10-29 MED ORDER — PROPOFOL 500 MG/50ML IV EMUL
INTRAVENOUS | Status: DC | PRN
  Administered 2017-10-29: 75 ug/kg/min via INTRAVENOUS

## 2017-10-29 MED ORDER — ACETAMINOPHEN 325 MG PO TABS
ORAL_TABLET | ORAL | Status: AC
  Administered 2017-10-29: 1000 mg via ORAL
  Filled 2017-10-29: qty 3

## 2017-10-29 MED ORDER — MEPERIDINE HCL 50 MG/ML IJ SOLN: 6.2500 mg | INTRAMUSCULAR | Status: DC | PRN

## 2017-10-29 MED ORDER — METHOCARBAMOL 500 MG PO TABS
500.0000 mg | ORAL_TABLET | Freq: Four times a day (QID) | ORAL | Status: DC | PRN
  Administered 2017-10-29: 500 mg via ORAL
  Filled 2017-10-29: qty 1

## 2017-10-29 MED ORDER — FENTANYL CITRATE (PF) 100 MCG/2ML IJ SOLN
100.0000 ug | Freq: Once | INTRAMUSCULAR | Status: AC
  Administered 2017-10-29: 50 ug via INTRAVENOUS

## 2017-10-29 MED ORDER — FENTANYL CITRATE (PF) 100 MCG/2ML IJ SOLN
INTRAMUSCULAR | Status: DC | PRN
  Administered 2017-10-29: 50 ug via INTRAVENOUS

## 2017-10-29 MED ORDER — FENTANYL CITRATE (PF) 250 MCG/5ML IJ SOLN
INTRAMUSCULAR | Status: AC
  Filled 2017-10-29: qty 5

## 2017-10-29 MED ORDER — METOCLOPRAMIDE HCL 5 MG PO TABS: 5.0000 mg | ORAL_TABLET | Freq: Three times a day (TID) | ORAL | Status: DC | PRN

## 2017-10-29 MED ORDER — MIDAZOLAM HCL 2 MG/2ML IJ SOLN
INTRAMUSCULAR | Status: AC
  Filled 2017-10-29: qty 2

## 2017-10-29 MED ORDER — FLEET ENEMA 7-19 GM/118ML RE ENEM: 1.0000 | ENEMA | Freq: Once | RECTAL | Status: DC | PRN

## 2017-10-29 MED ORDER — MENTHOL 3 MG MT LOZG: 1.0000 | LOZENGE | OROMUCOSAL | Status: DC | PRN

## 2017-10-29 MED ORDER — ATORVASTATIN CALCIUM 10 MG PO TABS
10.0000 mg | ORAL_TABLET | Freq: Every evening | ORAL | Status: DC
  Administered 2017-10-29: 10 mg via ORAL
  Filled 2017-10-29: qty 1

## 2017-10-29 MED ORDER — PROMETHAZINE HCL 25 MG/ML IJ SOLN: 6.2500 mg | INTRAMUSCULAR | Status: DC | PRN

## 2017-10-29 MED ORDER — ONDANSETRON HCL 4 MG PO TABS: 4.0000 mg | ORAL_TABLET | Freq: Four times a day (QID) | ORAL | Status: DC | PRN

## 2017-10-29 MED ORDER — DOCUSATE SODIUM 100 MG PO CAPS
100.0000 mg | ORAL_CAPSULE | Freq: Two times a day (BID) | ORAL | Status: DC
  Administered 2017-10-29: 100 mg via ORAL
  Filled 2017-10-29 (×3): qty 1

## 2017-10-29 MED ORDER — MIDAZOLAM HCL 2 MG/2ML IJ SOLN: 0.5000 mg | Freq: Once | INTRAMUSCULAR | Status: DC | PRN

## 2017-10-29 MED ORDER — GABAPENTIN 300 MG PO CAPS
ORAL_CAPSULE | ORAL | Status: AC
  Administered 2017-10-29: 300 mg via ORAL
  Filled 2017-10-29: qty 1

## 2017-10-29 MED ORDER — PROPOFOL 1000 MG/100ML IV EMUL
INTRAVENOUS | Status: AC
  Filled 2017-10-29: qty 100

## 2017-10-29 MED ORDER — DEXAMETHASONE SODIUM PHOSPHATE 10 MG/ML IJ SOLN
INTRAMUSCULAR | Status: AC
  Filled 2017-10-29: qty 1

## 2017-10-29 MED ORDER — BUPIVACAINE-EPINEPHRINE (PF) 0.25% -1:200000 IJ SOLN
INTRAMUSCULAR | Status: DC | PRN
  Administered 2017-10-29: 30 mL via PERINEURAL

## 2017-10-29 MED ORDER — LIDOCAINE 2% (20 MG/ML) 5 ML SYRINGE
INTRAMUSCULAR | Status: AC
  Filled 2017-10-29: qty 5

## 2017-10-29 MED ORDER — HYDROMORPHONE HCL 1 MG/ML IJ SOLN: 0.2500 mg | INTRAMUSCULAR | Status: DC | PRN

## 2017-10-29 MED ORDER — PROPOFOL 10 MG/ML IV BOLUS
INTRAVENOUS | Status: AC
  Filled 2017-10-29: qty 20

## 2017-10-29 MED ORDER — MIDAZOLAM HCL 2 MG/2ML IJ SOLN
2.0000 mg | Freq: Once | INTRAMUSCULAR | Status: AC
  Administered 2017-10-29: 1 mg via INTRAVENOUS

## 2017-10-29 MED ORDER — SUCCINYLCHOLINE CHLORIDE 200 MG/10ML IV SOSY
PREFILLED_SYRINGE | INTRAVENOUS | Status: AC
  Filled 2017-10-29: qty 10

## 2017-10-29 MED ORDER — PHENYLEPHRINE 40 MCG/ML (10ML) SYRINGE FOR IV PUSH (FOR BLOOD PRESSURE SUPPORT)
PREFILLED_SYRINGE | INTRAVENOUS | Status: AC
  Filled 2017-10-29: qty 10

## 2017-10-29 MED ORDER — ROCURONIUM BROMIDE 10 MG/ML (PF) SYRINGE
PREFILLED_SYRINGE | INTRAVENOUS | Status: AC
  Filled 2017-10-29: qty 10

## 2017-10-29 MED ORDER — MIDAZOLAM HCL 2 MG/2ML IJ SOLN
INTRAMUSCULAR | Status: AC
  Administered 2017-10-29: 1 mg via INTRAVENOUS
  Filled 2017-10-29: qty 2

## 2017-10-29 MED ORDER — OXYCODONE HCL 5 MG PO TABS: 5.0000 mg | ORAL_TABLET | ORAL | Status: DC | PRN

## 2017-10-29 MED ORDER — LOSARTAN POTASSIUM 50 MG PO TABS
100.0000 mg | ORAL_TABLET | Freq: Every evening | ORAL | Status: DC
  Administered 2017-10-29: 100 mg via ORAL
  Filled 2017-10-29: qty 2

## 2017-10-29 MED ORDER — ACETAMINOPHEN 500 MG PO TABS
1000.0000 mg | ORAL_TABLET | Freq: Once | ORAL | Status: AC
  Administered 2017-10-29: 1000 mg via ORAL
  Filled 2017-10-29: qty 2

## 2017-10-29 MED ORDER — BUPIVACAINE-EPINEPHRINE (PF) 0.25% -1:200000 IJ SOLN
INTRAMUSCULAR | Status: AC
  Filled 2017-10-29: qty 30

## 2017-10-29 MED ORDER — METHOCARBAMOL 1000 MG/10ML IJ SOLN
500.0000 mg | Freq: Four times a day (QID) | INTRAVENOUS | Status: DC | PRN
  Filled 2017-10-29: qty 5

## 2017-10-29 MED ORDER — FERROUS SULFATE 325 (65 FE) MG PO TABS
325.0000 mg | ORAL_TABLET | Freq: Three times a day (TID) | ORAL | Status: DC
  Administered 2017-10-29 – 2017-10-30 (×3): 325 mg via ORAL
  Filled 2017-10-29 (×3): qty 1

## 2017-10-29 MED ORDER — SODIUM CHLORIDE 0.9 % IV SOLN
INTRAVENOUS | Status: DC | PRN
  Administered 2017-10-29: 30 ug/min via INTRAVENOUS

## 2017-10-29 MED ORDER — ASPIRIN EC 325 MG PO TBEC
325.0000 mg | DELAYED_RELEASE_TABLET | Freq: Two times a day (BID) | ORAL | Status: DC
  Administered 2017-10-29 – 2017-10-30 (×2): 325 mg via ORAL
  Filled 2017-10-29 (×2): qty 1

## 2017-10-29 MED ORDER — DIPHENHYDRAMINE HCL 12.5 MG/5ML PO ELIX: 12.5000 mg | ORAL_SOLUTION | ORAL | Status: DC | PRN

## 2017-10-29 MED ORDER — TRAMADOL HCL 50 MG PO TABS
50.0000 mg | ORAL_TABLET | Freq: Four times a day (QID) | ORAL | Status: DC
  Administered 2017-10-29 – 2017-10-30 (×4): 50 mg via ORAL
  Filled 2017-10-29 (×4): qty 1

## 2017-10-29 MED ORDER — HYDROMORPHONE HCL 1 MG/ML IJ SOLN: 0.5000 mg | INTRAMUSCULAR | Status: DC | PRN

## 2017-10-29 MED ORDER — PANTOPRAZOLE SODIUM 40 MG PO TBEC
40.0000 mg | DELAYED_RELEASE_TABLET | Freq: Every day | ORAL | Status: DC
  Administered 2017-10-29 – 2017-10-30 (×2): 40 mg via ORAL
  Filled 2017-10-29 (×2): qty 1

## 2017-10-29 MED ORDER — LACTATED RINGERS IV SOLN
INTRAVENOUS | Status: DC
  Administered 2017-10-29: 07:00:00 via INTRAVENOUS

## 2017-10-29 MED ORDER — FENTANYL CITRATE (PF) 100 MCG/2ML IJ SOLN
INTRAMUSCULAR | Status: AC
  Administered 2017-10-29: 50 ug via INTRAVENOUS
  Filled 2017-10-29: qty 2

## 2017-10-29 MED ORDER — SODIUM CHLORIDE 0.9 % IJ SOLN
INTRAMUSCULAR | Status: DC | PRN
  Administered 2017-10-29: 20 mL

## 2017-10-29 MED ORDER — SENNOSIDES-DOCUSATE SODIUM 8.6-50 MG PO TABS: 1.0000 | ORAL_TABLET | Freq: Every evening | ORAL | Status: DC | PRN

## 2017-10-29 MED ORDER — TRANEXAMIC ACID 1000 MG/10ML IV SOLN
1000.0000 mg | Freq: Once | INTRAVENOUS | Status: AC
  Administered 2017-10-29: 1000 mg via INTRAVENOUS
  Filled 2017-10-29: qty 10

## 2017-10-29 MED ORDER — METOCLOPRAMIDE HCL 5 MG/ML IJ SOLN: 5.0000 mg | Freq: Three times a day (TID) | INTRAMUSCULAR | Status: DC | PRN

## 2017-10-29 MED ORDER — EPHEDRINE SULFATE 50 MG/ML IJ SOLN
INTRAMUSCULAR | Status: AC
  Filled 2017-10-29: qty 1

## 2017-10-29 MED ORDER — AMLODIPINE BESYLATE 2.5 MG PO TABS
2.5000 mg | ORAL_TABLET | Freq: Every evening | ORAL | Status: DC
  Administered 2017-10-29: 2.5 mg via ORAL
  Filled 2017-10-29: qty 1

## 2017-10-29 SURGICAL SUPPLY — 64 items
BANDAGE ACE 6X5 VEL STRL LF (GAUZE/BANDAGES/DRESSINGS) ×3 IMPLANT
BANDAGE ESMARK 6X9 LF (GAUZE/BANDAGES/DRESSINGS) ×1 IMPLANT
BLADE SAGITTAL 13X1.27X60 (BLADE) ×2 IMPLANT
BLADE SAGITTAL 13X1.27X60MM (BLADE) ×1
BLADE SAW SGTL 83.5X18.5 (BLADE) ×3 IMPLANT
BLADE SURG 10 STRL SS (BLADE) ×3 IMPLANT
BNDG ESMARK 6X9 LF (GAUZE/BANDAGES/DRESSINGS) ×3
BOWL SMART MIX CTS (DISPOSABLE) ×3 IMPLANT
CEMENT BONE SIMPLEX SPEEDSET (Cement) ×6 IMPLANT
CLOSURE WOUND 1/2 X4 (GAUZE/BANDAGES/DRESSINGS) ×1
COVER SURGICAL LIGHT HANDLE (MISCELLANEOUS) ×3 IMPLANT
CUFF TOURNIQUET SINGLE 34IN LL (TOURNIQUET CUFF) ×3 IMPLANT
DRAPE EXTREMITY T 121X128X90 (DRAPE) ×3 IMPLANT
DRAPE INCISE IOBAN 66X45 STRL (DRAPES) ×6 IMPLANT
DRAPE U-SHAPE 47X51 STRL (DRAPES) ×3 IMPLANT
DRSG AQUACEL AG ADV 3.5X10 (GAUZE/BANDAGES/DRESSINGS) ×3 IMPLANT
DURAPREP 26ML APPLICATOR (WOUND CARE) ×6 IMPLANT
ELECT REM PT RETURN 9FT ADLT (ELECTROSURGICAL) ×3
ELECTRODE REM PT RTRN 9FT ADLT (ELECTROSURGICAL) ×1 IMPLANT
FEMUR  CMT CCR STD SZ10 R KNEE (Knees) ×2 IMPLANT
FEMUR CMT CCR STD SZ10 R KNEE (Knees) ×1 IMPLANT
FEMUR CMTD CCR STD SZ10 R KNEE (Knees) ×1 IMPLANT
GLOVE BIOGEL M 7.0 STRL (GLOVE) IMPLANT
GLOVE BIOGEL PI IND STRL 7.5 (GLOVE) IMPLANT
GLOVE BIOGEL PI IND STRL 8.5 (GLOVE) ×1 IMPLANT
GLOVE BIOGEL PI INDICATOR 7.5 (GLOVE)
GLOVE BIOGEL PI INDICATOR 8.5 (GLOVE) ×2
GLOVE SURG ORTHO 8.0 STRL STRW (GLOVE) ×6 IMPLANT
GOWN STRL REUS W/ TWL LRG LVL3 (GOWN DISPOSABLE) ×1 IMPLANT
GOWN STRL REUS W/ TWL XL LVL3 (GOWN DISPOSABLE) ×2 IMPLANT
GOWN STRL REUS W/TWL 2XL LVL3 (GOWN DISPOSABLE) ×3 IMPLANT
GOWN STRL REUS W/TWL LRG LVL3 (GOWN DISPOSABLE) ×2
GOWN STRL REUS W/TWL XL LVL3 (GOWN DISPOSABLE) ×4
HANDPIECE INTERPULSE COAX TIP (DISPOSABLE) ×2
HOOD PEEL AWAY FACE SHEILD DIS (HOOD) ×9 IMPLANT
INSERT TIBIAL PLY R GH10-11X10 (Insert) ×3 IMPLANT
KIT BASIN OR (CUSTOM PROCEDURE TRAY) ×3 IMPLANT
KIT TURNOVER KIT B (KITS) ×3 IMPLANT
MANIFOLD NEPTUNE II (INSTRUMENTS) ×3 IMPLANT
NEEDLE 18GX1X1/2 (RX/OR ONLY) (NEEDLE) IMPLANT
NEEDLE 22X1 1/2 (OR ONLY) (NEEDLE) ×6 IMPLANT
NS IRRIG 1000ML POUR BTL (IV SOLUTION) ×3 IMPLANT
PACK TOTAL JOINT (CUSTOM PROCEDURE TRAY) ×3 IMPLANT
PAD ARMBOARD 7.5X6 YLW CONV (MISCELLANEOUS) ×6 IMPLANT
SET HNDPC FAN SPRY TIP SCT (DISPOSABLE) ×1 IMPLANT
STEM POLY PAT PLY 38M KNEE (Knees) ×3 IMPLANT
STEM TIBIA 5 DEG SZ G R KNEE (Knees) ×1 IMPLANT
STRIP CLOSURE SKIN 1/2X4 (GAUZE/BANDAGES/DRESSINGS) ×2 IMPLANT
SUCTION FRAZIER HANDLE 10FR (MISCELLANEOUS) ×2
SUCTION TUBE FRAZIER 10FR DISP (MISCELLANEOUS) ×1 IMPLANT
SUT BONE WAX W31G (SUTURE) ×3 IMPLANT
SUT MNCRL AB 3-0 PS2 18 (SUTURE) ×3 IMPLANT
SUT VIC AB 0 CTB1 27 (SUTURE) ×3 IMPLANT
SUT VIC AB 1 CT1 27 (SUTURE) ×4
SUT VIC AB 1 CT1 27XBRD ANBCTR (SUTURE) ×2 IMPLANT
SUT VIC AB 2-0 CT1 27 (SUTURE) ×4
SUT VIC AB 2-0 CT1 TAPERPNT 27 (SUTURE) ×2 IMPLANT
SUT VLOC 180 0 24IN GS25 (SUTURE) ×3 IMPLANT
SYR 20CC LL (SYRINGE) ×6 IMPLANT
TAPE STRIPS DRAPE STRL (GAUZE/BANDAGES/DRESSINGS) ×3 IMPLANT
TIBIA STEM 5 DEG SZ G R KNEE (Knees) ×3 IMPLANT
TOWEL OR 17X24 6PK STRL BLUE (TOWEL DISPOSABLE) ×3 IMPLANT
TOWEL OR 17X26 10 PK STRL BLUE (TOWEL DISPOSABLE) ×3 IMPLANT
WRAP KNEE MAXI GEL POST OP (GAUZE/BANDAGES/DRESSINGS) ×3 IMPLANT

## 2017-10-29 NOTE — Progress Notes (Signed)
Orthopedic Tech Progress Note Patient Details:  Jesse Ali 1950-06-07 [id number]  CPM Right Knee CPM Right Knee: On Right Knee Flexion (Degrees): 90 Right Knee Extension (Degrees): 0 Additional Comments: trapeze bar patient helper  Post Interventions Patient Tolerated: Well Instructions Provided: Care of device Viewed order from doctor's order list Hildred Priest 10/29/2017, 10:55 AM

## 2017-10-29 NOTE — H&P (Signed)
Jesse Ali MRN:  [id number] DOB/SEX:  06-15-1950/male  CHIEF COMPLAINT:  Painful right Knee  HISTORY: Patient is a 67 y.o. male presented with a history of pain in the right knee. Onset of symptoms was gradual starting a few years ago with gradually worsening course since that time. Patient has been treated conservatively with over-the-counter NSAIDs and activity modification. Patient currently rates pain in the knee at 10 out of 10 with activity. There is pain at night.  PAST MEDICAL HISTORY: Patient Active Problem List   Diagnosis Date Noted  . 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 Arthritis of left knee 03/29/2016  . Bilateral primary osteoarthritis of knee 02/08/2016   Past Medical History:  Diagnosis Date  . Arthritis    fingers  . CKD (chronic kidney disease), stage III ([place])    patient denies  . Hyperlipidemia   . Hypertension   . Sleep apnea    wears CPAP   . Wears glasses   . Wears glasses    Past Surgical History:  Procedure Laterality Date  . APPENDECTOMY    . COLONOSCOPY    . JOINT REPLACEMENT    . KNEE ARTHROSCOPY Right 1998   2x  . KNEE ARTHROSCOPY Left 1985  . SHOULDER SURGERY Right 2007   removal bone spurs  . TOTAL KNEE ARTHROPLASTY Left 03/29/2016   Procedure: LEFT TOTAL KNEE ARTHROPLASTY;  Surgeon: Marybelle Killings, MD;  Location: [facility];  Service: Orthopedics;  Laterality: Left;     MEDICATIONS:   Medications Prior to Admission  Medication Sig Dispense Refill Last Dose  . amLODipine (NORVASC) 2.5 MG tablet Take 2.5 mg by mouth every evening.     Marland Kitchen aspirin EC 81 MG tablet Take 81 mg by mouth every evening.    Taking  . atorvastatin (LIPITOR) 10 MG tablet Take 10 mg by mouth every evening.    Taking  . azelastine (ASTELIN) 0.1 % nasal spray Place 1 spray into both nostrils daily at 12 noon. Use in each nostril as directed     . fluticasone (FLONASE) 50 MCG/ACT nasal spray Place 1 spray into both  nostrils every evening.    Taking  . gabapentin (NEURONTIN) 100 MG capsule Take 100 mg by mouth every evening.     Marland Kitchen losartan (COZAAR) 100 MG tablet Take 100 mg by mouth every evening.     Marland Kitchen LYSINE PO Take 1,000 mg by mouth every evening.     . naproxen sodium (ALEVE) 220 MG tablet Take 220 mg by mouth every evening.       ALLERGIES:  No Known Allergies  REVIEW OF SYSTEMS:  A comprehensive review of systems was negative except for: Musculoskeletal: positive for arthralgias and bone pain   FAMILY HISTORY:  No family history on file.  SOCIAL HISTORY:   Social History   Tobacco Use  . Smoking status: Former Smoker    Packs/day: 1.00    Years: 20.00    Pack years: 20.00    Types: Cigarettes    Last attempt to quit: 1985    Years since quitting: 34.5  . Smokeless tobacco: Never Used  Substance Use Topics  . Alcohol use: No     EXAMINATION:  Vital signs in last 24 hours:    There were no vitals taken for this visit.  General Appearance:    Alert, cooperative, no distress, appears stated age  Head:    Normocephalic, without obvious abnormality, atraumatic  Eyes:    PERRL, conjunctiva/corneas clear, EOM's intact, fundi    benign, both eyes       Ears:    Normal TM's and external ear canals, both ears  Nose:   Nares normal, septum midline, mucosa normal, no drainage    or sinus tenderness  Throat:   Lips, mucosa, and tongue normal; teeth and gums normal  Neck:   Supple, symmetrical, trachea midline, no adenopathy;       thyroid:  No enlargement/tenderness/nodules; no carotid   bruit or JVD  Back:     Symmetric, no curvature, ROM normal, no CVA tenderness  Lungs:     Clear to auscultation bilaterally, respirations unlabored  Chest wall:    No tenderness or deformity  Heart:    Regular rate and rhythm, S1 and S2 normal, no murmur, rub   or gallop  Abdomen:     Soft, non-tender, bowel sounds active all four quadrants,    no masses, no organomegaly  Genitalia:    Normal male  without lesion, discharge or tenderness  Rectal:    Normal tone, normal prostate, no masses or tenderness;   guaiac negative stool  Extremities:   Extremities normal, atraumatic, no cyanosis or edema  Pulses:   2+ and symmetric all extremities  Skin:   Skin color, texture, turgor normal, no rashes or lesions  Lymph nodes:   Cervical, supraclavicular, and axillary nodes normal  Neurologic:   CNII-XII intact. Normal strength, sensation and reflexes      throughout    Musculoskeletal:  ROM 0-120, Ligaments intact,  Imaging Review Plain radiographs demonstrate severe degenerative joint disease of the right knee. The overall alignment is neutral. The bone quality appears to be excellent for age and reported activity level.  Assessment/Plan: Primary osteoarthritis, right knee   The patient history, physical examination and imaging studies are consistent with advanced degenerative joint disease of the right knee. The patient has failed conservative treatment.  The clearance notes were reviewed.  After discussion with the patient it was felt that Total Knee Replacement was indicated. The procedure,  risks, and benefits of total knee arthroplasty were presented and reviewed. The risks including but not limited to aseptic loosening, infection, blood clots, vascular injury, stiffness, patella tracking problems complications among others were discussed. The patient acknowledged the explanation, agreed to proceed with the plan.  Preoperative templating of the joint replacement has been completed, documented, and submitted to the Operating Room personnel in order to optimize intra-operative equipment management.   Patient's anticipated LOS is less than 2 midnights, meeting these requirements: - Lives within 1 hour of care - Has a competent adult at home to recover with post-op recover - NO history of  - Chronic pain requiring opiods  - Diabetes  - Coronary Artery Disease  - Heart failure  - Heart  attack  - Stroke  - DVT/VTE  - Cardiac arrhythmia  - Respiratory Failure/COPD  - Renal failure  - Anemia  - Advanced Liver disease       Donia Ast 10/29/2017, 6:13 AM

## 2017-10-29 NOTE — Transfer of Care (Signed)
Immediate Anesthesia Transfer of Care Note  Patient: Jesse Ali  Procedure(s) Performed: RIGHT TOTAL KNEE ARTHROPLASTY (Right Knee)  Patient Location: PACU  Anesthesia Type:Spinal and MAC combined with regional for post-op pain  Level of Consciousness: awake, alert , oriented and patient cooperative  Airway & Oxygen Therapy: Patient Spontanous Breathing  Post-op Assessment: Report given to RN and Post -op Vital signs reviewed and stable  Post vital signs: Reviewed and stable  Last Vitals:  Vitals Value Taken Time  BP 125/113 10/29/2017 10:36 AM  Temp    Pulse 67 10/29/2017 10:36 AM  Resp 13 10/29/2017 10:37 AM  SpO2 92 % 10/29/2017 10:36 AM  Vitals shown include unvalidated device data.  Last Pain:  Vitals:   10/29/17 0703  TempSrc:   PainSc: 0-No pain      Patients Stated Pain Goal: 3 (55/[id number])  Complications: No apparent anesthesia complications

## 2017-10-29 NOTE — Evaluation (Signed)
Physical Therapy Evaluation Patient Details Name: Jesse Ali MRN: [id number] DOB: 1950-10-09 Today's Date: 10/29/2017   History of Present Illness  Pt is a 67 y/o male s/p elective R TKA. PMH includes CKD, HTN, L TKA, and OSA on CPAP.   Clinical Impression  Pt is s/p surgery above with deficits below. Pt tolerated gait training well and required min to min guard A for mobility with RW. Reviewed knee precautions and supine HEP. Will continue to follow acutely to maximize functional mobility independence and safety.     Follow Up Recommendations Follow surgeon's recommendation for DC plan and follow-up therapies;Supervision for mobility/OOB    Equipment Recommendations  None recommended by PT(refused 3 in 1)    Recommendations for Other Services       Precautions / Restrictions Precautions Precautions: Knee Precaution Booklet Issued: Yes (comment) Precaution Comments: REviewed knee precautions and supine HEP.  Restrictions Weight Bearing Restrictions: Yes RLE Weight Bearing: Weight bearing as tolerated      Mobility  Bed Mobility Overal bed mobility: Needs Assistance Bed Mobility: Supine to Sit     Supine to sit: Supervision     General bed mobility comments: Supervision for safety. Increased time required.   Transfers Overall transfer level: Needs assistance Equipment used: Rolling walker (2 wheeled) Transfers: Sit to/from Stand Sit to Stand: Min assist         General transfer comment: Min A for lift assist and steadying. Verbal cues for safe hand placement.   Ambulation/Gait Ambulation/Gait assistance: Min guard Gait Distance (Feet): 50 Feet Assistive device: Rolling walker (2 wheeled) Gait Pattern/deviations: Step-to pattern;Decreased step length - right;Decreased step length - left;Decreased weight shift to right;Antalgic Gait velocity: Decreased    General Gait Details: Slow, antalgic gait secondary to post op pain. Verbal cues for safe use of RW  throughout gait and for LE sequencing during gait.   Stairs            Wheelchair Mobility    Modified Rankin (Stroke Patients Only)       Balance Overall balance assessment: Needs assistance Sitting-balance support: No upper extremity supported;Feet supported Sitting balance-Leahy Scale: Good     Standing balance support: Bilateral upper extremity supported;During functional activity Standing balance-Leahy Scale: Poor Standing balance comment: Reliant on BUE support.                              Pertinent Vitals/Pain Pain Assessment: 0-10 Pain Score: 5  Pain Location: R knee  Pain Descriptors / Indicators: Aching;Operative site guarding Pain Intervention(s): Limited activity within patient's tolerance;Monitored during session;Repositioned    Home Living Family/patient expects to be discharged to:: Private residence Living Arrangements: Spouse/significant other Available Help at Discharge: Family;Available 24 hours/day Type of Home: House Home Access: Stairs to enter Entrance Stairs-Rails: None Entrance Stairs-Number of Steps: 3 Home Layout: One level Home Equipment: Environmental consultant - 2 wheels      Prior Function Level of Independence: Independent               Hand Dominance        Extremity/Trunk Assessment   Upper Extremity Assessment Upper Extremity Assessment: Overall WFL for tasks assessed    Lower Extremity Assessment Lower Extremity Assessment: RLE deficits/detail RLE Deficits / Details: Reports some numbness at the toes. Deficits consistent with post op pain and weakness. Able to perform ther ex below.        Communication   Communication: No difficulties  Cognition  Arousal/Alertness: Awake/alert Behavior During Therapy: WFL for tasks assessed/performed Overall Cognitive Status: Within Functional Limits for tasks assessed                                        General Comments      Exercises Total Joint  Exercises Ankle Circles/Pumps: AROM;Both;20 reps Quad Sets: AROM;Right;10 reps Heel Slides: AROM;Right;10 reps   Assessment/Plan    PT Assessment Patient needs continued PT services  PT Problem List Decreased strength;Decreased range of motion;Decreased balance;Decreased mobility;Decreased knowledge of use of DME;Decreased knowledge of precautions;Pain       PT Treatment Interventions DME instruction;Gait training;Stair training;Functional mobility training;Therapeutic activities;Therapeutic exercise;Balance training;Patient/family education    PT Goals (Current goals can be found in the Care Plan section)  Acute Rehab PT Goals Patient Stated Goal: to go home tomorrow  PT Goal Formulation: With patient Time For Goal Achievement: 11/12/17 Potential to Achieve Goals: Good    Frequency 7X/week   Barriers to discharge        Co-evaluation               AM-PAC PT "6 Clicks" Daily Activity  Outcome Measure Difficulty turning over in bed (including adjusting bedclothes, sheets and blankets)?: A Little Difficulty moving from lying on back to sitting on the side of the bed? : A Little Difficulty sitting down on and standing up from a chair with arms (e.g., wheelchair, bedside commode, etc,.)?: Unable Help needed moving to and from a bed to chair (including a wheelchair)?: A Little Help needed walking in hospital room?: A Little Help needed climbing 3-5 steps with a railing? : A Lot 6 Click Score: 15    End of Session Equipment Utilized During Treatment: Gait belt Activity Tolerance: Patient tolerated treatment well Patient left: in chair;with call bell/phone within reach Nurse Communication: Mobility status PT Visit Diagnosis: Other abnormalities of gait and mobility (R26.89);Pain Pain - Right/Left: Right Pain - part of body: Knee    Time: 1353-1407 PT Time Calculation (min) (ACUTE ONLY): 14 min   Charges:   PT Evaluation $PT Eval Low Complexity: 1 Low     PT G  Codes:        Leighton Ruff, PT, DPT  Acute Rehabilitation Services  Pager: [phone]   Rudean Hitt 10/29/2017, 2:15 PM

## 2017-10-29 NOTE — Anesthesia Postprocedure Evaluation (Signed)
Anesthesia Post Note  Patient: Jesse Ali  Procedure(s) Performed: RIGHT TOTAL KNEE ARTHROPLASTY (Right Knee)     Patient location during evaluation: PACU Anesthesia Type: Spinal Level of consciousness: awake and alert, patient cooperative and oriented Pain management: pain level controlled Vital Signs Assessment: post-procedure vital signs reviewed and stable Respiratory status: spontaneous breathing, nonlabored ventilation and respiratory function stable Cardiovascular status: blood pressure returned to baseline and stable Postop Assessment: spinal receding, patient able to bend at knees and no apparent nausea or vomiting Anesthetic complications: no    Last Vitals:  Vitals:   10/29/17 1206 10/29/17 1225  BP:  131/85  Pulse: (!) 57 (!) 58  Resp: 15 16  Temp: (!) 36.4 C (!) 36.4 C  SpO2: 98% 100%    Last Pain:  Vitals:   10/29/17 1225  TempSrc: Oral  PainSc:         RLE Motor Response: Purposeful movement;Responds to commands (10/29/17 1236) RLE Sensation: Decreased (10/29/17 1236)      Channie Bostick,E. Tharon Kitch

## 2017-10-29 NOTE — Op Note (Signed)
TOTAL KNEE REPLACEMENT OPERATIVE NOTE:  10/29/2017  3:46 PM  PATIENT:  Jesse Ali  67 y.o. male  PRE-OPERATIVE DIAGNOSIS:  primary osteoarthritis right knee  POST-OPERATIVE DIAGNOSIS:  primary osteoarthritis right knee  PROCEDURE:  Procedure(s): RIGHT TOTAL KNEE ARTHROPLASTY  SURGEON:  Surgeon(s): Vickey Huger, MD  PHYSICIAN ASSISTANT: Nehemiah Massed, [facility]  ANESTHESIA:   spinal  DRAINS: Hemovac  SPECIMEN: None  COUNTS:  Correct  TOURNIQUET:   Total Tourniquet Time Documented: Thigh (Right) - 43 minutes Total: Thigh (Right) - 43 minutes   DICTATION:  Indication for procedure:    The patient is a 67 y.o. male who has failed conservative treatment for primary osteoarthritis right knee.  Informed consent was obtained prior to anesthesia. The risks versus benefits of the operation were explain and in a way the patient can, and did, understand.   On the implant demand matching protocol, this patient scored 8.  Therefore, this patient was not receive a polyethylene insert with vitamin E which is a high demand implant.  Description of procedure:     The patient was taken to the operating room and placed under anesthesia.  The patient was positioned in the usual fashion taking care that all body parts were adequately padded and/or protected.  I foley catheter was not placed.  A tourniquet was applied and the leg prepped and draped in the usual sterile fashion.  The extremity was exsanguinated with the esmarch and tourniquet inflated to 350 mmHg.  Pre-operative range of motion was normal.  The knee was in 6 degree of significant varus.  A midline incision approximately 6-7 inches long was made with a #10 blade.  A new blade was used to make a parapatellar arthrotomy going 2-3 cm into the quadriceps tendon, over the patella, and alongside the medial aspect of the patellar tendon.  A synovectomy was then performed with the #10 blade and forceps. I then elevated the deep MCL off the  medial tibial metaphysis subperiosteally around to the semimembranosus attachment.    I everted the patella and used calipers to measure patellar thickness.  I used the reamer to ream down to appropriate thickness to recreate the native thickness.  I then removed excess bone with the rongeur and sagittal saw.  I used the appropriately sized template and drilled the three lug holes.  I then put the trial in place and measured the thickness with the calipers to ensure recreation of the native thickness.  The trial was then removed and the patella subluxed and the knee brought into flexion.  A homan retractor was place to retract and protect the patella and lateral structures.  A Z-retractor was place medially to protect the medial structures.  The extra-medullary alignment system was used to make cut the tibial articular surface perpendicular to the anamotic axis of the tibia and in 3 degrees of posterior slope.  The cut surface and alignment jig was removed.  I then used the intramedullary alignment guide to make a 6 valgus cut on the distal femur.  I then marked out the epicondylar axis on the distal femur.  The posterior condylar axis measured 3 degrees.  I then used the anterior referencing sizer and measured the femur to be a size 10.  The 4-In-1 cutting block was screwed into place in external rotation matching the posterior condylar angle, making our cuts perpendicular to the epicondylar axis.  Anterior, posterior and chamfer cuts were made with the sagittal saw.  The cutting block and cut pieces were  removed.  A lamina spreader was placed in 90 degrees of flexion.  The ACL, PCL, menisci, and posterior condylar osteophytes were removed.  A 10 mm spacer blocked was found to offer good flexion and extension gap balance after moderate in degree releasing.   The scoop retractor was then placed and the femoral finishing block was pinned in place.  The small sagittal saw was used as well as the lug drill to  finish the femur.  The block and cut surfaces were removed and the medullary canal hole filled with autograft bone from the cut pieces.  The tibia was delivered forward in deep flexion and external rotation.  A size G tray was selected and pinned into place centered on the medial 1/3 of the tibial tubercle.  The reamer and keel was used to prepare the tibia through the tray.    I then trialed with the size 10 femur, size G tibia, a 10 mm insert and the 38 patella.  I had excellent flexion/extension gap balance, excellent patella tracking.  Flexion was full and beyond 120 degrees; extension was zero.  These components were chosen and the staff opened them to me on the back table while the knee was lavaged copiously and the cement mixed.  The soft tissue was infiltrated with 60cc of exparel 1.3% through a 21 gauge needle.  I cemented in the components and removed all excess cement.  The polyethylene tibial component was snapped into place and the knee placed in extension while cement was hardening.  The capsule was infilltrated with 30cc of .25% Marcaine with epinephrine.  A hemovac was place in the joint exiting superolaterally.  A pain pump was place superomedially superficial to the arthrotomy.  Once the cement was hard, the tourniquet was let down.  Hemostasis was obtained.  The arthrotomy was closed with figure-8 #1 vicryl sutures.  The deep soft tissues were closed with #0 vicryls and the subcuticular layer closed with a running #2-0 vicryl.  The skin was reapproximated and closed with skin staples.  The wound was dressed with xeroform, 4 x4's, 2 ABD sponges, a single layer of webril and a TED stocking.   The patient was then awakened, extubated, and taken to the recovery room in stable condition.  BLOOD LOSS:  300cc DRAINS: 1 hemovac, 1 pain catheter COMPLICATIONS:  None.  PLAN OF CARE: Admit for overnight observation  PATIENT DISPOSITION:  PACU - hemodynamically stable.   Delay start of  Pharmacological VTE agent (>24hrs) due to surgical blood loss or risk of bleeding:  not applicable  Please fax a copy of this op note to my office at [phone] (please only include page 1 and 2 of the Case Information op note)

## 2017-10-29 NOTE — Anesthesia Procedure Notes (Signed)
Anesthesia Regional Block: Adductor canal block   Pre-Anesthetic Checklist: ,, timeout performed, Correct Patient, Correct Site, Correct Laterality, Correct Procedure, Correct Position, site marked, Risks and benefits discussed,  Surgical consent,  Pre-op evaluation,  At surgeon's request and post-op pain management  Laterality: Right and Lower  Prep: chloraprep       Needles:  Injection technique: Single-shot  Needle Type: Echogenic Needle     Needle Length: 9cm  Needle Gauge: 21     Additional Needles:   Procedures:,,,, ultrasound used (permanent image in chart),,,,  Narrative:  Start time: 10/29/2017 8:11 AM End time: 10/29/2017 8:17 AM Injection made incrementally with aspirations every 5 mL.  Performed by: Personally  Anesthesiologist: Annye Asa, MD  Additional Notes: Pt identified in Holding room.  Monitors applied. Working IV access confirmed. Sterile prep, drape R thigh.  #21ga ECHOgenic needle into adductor canal with US guidance.  20cc 0.75% Ropivacaine injected incrementally after negative test dose.  Patient asymptomatic, VSS, no heme aspirated, tolerated well.  Jenita Seashore, MD

## 2017-10-29 NOTE — Anesthesia Procedure Notes (Signed)
Spinal  Patient location during procedure: OR End time: 10/29/2017 8:39 AM Staffing Anesthesiologist: Annye Asa, MD Performed: anesthesiologist  Preanesthetic Checklist Completed: patient identified, site marked, surgical consent, pre-op evaluation, timeout performed, IV checked, risks and benefits discussed and monitors and equipment checked Spinal Block Patient position: sitting Prep: site prepped and draped and DuraPrep Patient monitoring: heart rate, cardiac monitor, continuous pulse ox and blood pressure Approach: midline Location: L3-4 Injection technique: single-shot Needle Needle type: Pencan  Needle gauge: 24 G Needle length: 9 cm Additional Notes Pt identified in Operating room.  Monitors applied. Working IV access confirmed. Sterile prep, drape lumbar spine.  1% lido local L 3,4.  #24ga Pencan into clear CSF L 3,4.  15mg  0.75% Bupivacaine with dextrose injected with asp CSF beginning and end of injection.  Patient asymptomatic, VSS, no heme aspirated, tolerated well.  Jenita Seashore, MD

## 2017-10-30 ENCOUNTER — Encounter (HOSPITAL_COMMUNITY): Payer: Self-pay | Admitting: Orthopedic Surgery

## 2017-10-30 DIAGNOSIS — Z96651 Presence of right artificial knee joint: Secondary | ICD-10-CM | POA: Diagnosis not present

## 2017-10-30 DIAGNOSIS — M1711 Unilateral primary osteoarthritis, right knee: Secondary | ICD-10-CM | POA: Diagnosis not present

## 2017-10-30 LAB — CBC
HEMATOCRIT: 40.2 % (ref 39.0–52.0)
Hemoglobin: 13.3 g/dL (ref 13.0–17.0)
MCH: 30.7 pg (ref 26.0–34.0)
MCHC: 33.1 g/dL (ref 30.0–36.0)
MCV: 92.8 fL (ref 78.0–100.0)
Platelets: 205 10*3/uL (ref 150–400)
RBC: 4.33 MIL/uL (ref 4.22–5.81)
RDW: 13.2 % (ref 11.5–15.5)
WBC: 15.5 10*3/uL — AB (ref 4.0–10.5)

## 2017-10-30 LAB — BASIC METABOLIC PANEL
ANION GAP: 9 (ref 5–15)
BUN: 24 mg/dL — ABNORMAL HIGH (ref 8–23)
CALCIUM: 9.4 mg/dL (ref 8.9–10.3)
CO2: 24 mmol/L (ref 22–32)
CREATININE: 1.53 mg/dL — AB (ref 0.61–1.24)
Chloride: 105 mmol/L (ref 98–111)
GFR calc Af Amer: 52 mL/min — ABNORMAL LOW (ref >60)
GFR calc non Af Amer: 45 mL/min — ABNORMAL LOW (ref >60)
Glucose, Bld: 112 mg/dL — ABNORMAL HIGH (ref 70–99)
Potassium: 4.2 mmol/L (ref 3.5–5.1)
SODIUM: 138 mmol/L (ref 135–145)

## 2017-10-30 MED ORDER — OXYCODONE HCL 5 MG PO TABS: 5.0000 mg | ORAL_TABLET | Freq: Four times a day (QID) | ORAL | 0 refills | Status: DC | PRN

## 2017-10-30 MED ORDER — METHOCARBAMOL 500 MG PO TABS: 500.0000 mg | ORAL_TABLET | Freq: Four times a day (QID) | ORAL | 0 refills | Status: DC | PRN

## 2017-10-30 MED ORDER — ASPIRIN 325 MG PO TBEC: 325.0000 mg | DELAYED_RELEASE_TABLET | Freq: Two times a day (BID) | ORAL | 0 refills | Status: DC

## 2017-10-30 NOTE — Care Management Obs Status (Signed)
[facility] NOTIFICATION   Patient Details  Name: Jesse Ali MRN: [id number] Date of Birth: Jul 19, 1950   Medicare Observation Status Notification Given:  Yes    Ninfa Meeker, RN 10/30/2017, 10:25 AM

## 2017-10-30 NOTE — Discharge Summary (Signed)
SPORTS MEDICINE & JOINT REPLACEMENT   Lara Mulch, MD   Carlyon Shadow, PA-C [place], [place], [place]  [address]                             [phone]  PATIENT ID: Jesse Ali        MRN:  [id number]          DOB/AGE: 01-06-1951 / 67 y.o.    DISCHARGE SUMMARY  ADMISSION DATE:    10/29/2017 DISCHARGE DATE:   10/30/2017   ADMISSION DIAGNOSIS: primary osteoarthritis right knee    DISCHARGE DIAGNOSIS:  primary osteoarthritis right knee    ADDITIONAL DIAGNOSIS: Active Problems:   S/P total knee replacement  Past Medical History:  Diagnosis Date  . Arthritis    fingers  . CKD (chronic kidney disease), stage III ([place])    patient denies  . Hyperlipidemia   . Hypertension   . Sleep apnea    wears CPAP   . Wears glasses   . Wears glasses     PROCEDURE: Procedure(s): RIGHT TOTAL KNEE ARTHROPLASTY on 10/29/2017  CONSULTS:    HISTORY:  See H&P in chart  HOSPITAL COURSE:  Jesse Ali is a 66 y.o. admitted on 10/29/2017 and found to have a diagnosis of primary osteoarthritis right knee.  After appropriate laboratory studies were obtained  they were taken to the operating room on 10/29/2017 and underwent Procedure(s): RIGHT TOTAL KNEE ARTHROPLASTY.   They were given perioperative antibiotics:  Anti-infectives (From admission, onward)   Start     Dose/Rate Route Frequency Ordered Stop   10/29/17 1430  ceFAZolin (ANCEF) IVPB 2g/100 mL premix     2 g 200 mL/hr over 30 Minutes Intravenous Every 6 hours 10/29/17 1217 10/29/17 2046   10/29/17 0645  ceFAZolin (ANCEF) IVPB 2g/100 mL premix     2 g 200 mL/hr over 30 Minutes Intravenous On call to O.R. 10/29/17 0630 10/29/17 0853   10/29/17 0635  ceFAZolin (ANCEF) 2-4 GM/100ML-% IVPB    Note to Pharmacy:  Ernesta Amble   : cabinet override      10/29/17 7026 10/29/17 3785    .  Patient given tranexamic acid IV or topical and exparel intra-operatively.  Tolerated the procedure well.    POD# 1: Vital signs were  stable.  Patient denied Chest pain, shortness of breath, or calf pain.  Patient was started on Lovenox 30 mg subcutaneously twice daily at 8am.  Consults to PT, OT, and care management were made.  The patient was weight bearing as tolerated.  CPM was placed on the operative leg 0-90 degrees for 6-8 hours a day. When out of the CPM, patient was placed in the foam block to achieve full extension. Incentive spirometry was taught.  Dressing was changed.       POD #2, Continued  PT for ambulation and exercise program.  IV saline locked.  O2 discontinued.    The remainder of the hospital course was dedicated to ambulation and strengthening.   The patient was discharged on 1 Day Post-Op in  Good condition.  Blood products given:none  DIAGNOSTIC STUDIES: Recent vital signs:  Patient Vitals for the past 24 hrs:  BP Temp Temp src Pulse Resp SpO2 Height Weight  10/30/17 0514 132/76 98.1 F (36.7 C) Oral 64 16 99 % - -  10/29/17 2326 125/72 98 F (36.7 C) Oral 75 16 95 % - -  10/29/17 2000 - - - -  16 - - -  10/29/17 1225 131/85 (!) 97.5 F (36.4 C) Oral (!) 58 16 100 % - -  10/29/17 1206 - (!) 97.5 F (36.4 C) - (!) 57 15 98 % - -  10/29/17 1154 129/79 - - (!) 55 16 100 % - -  10/29/17 1108 115/90 - - (!) 52 15 - - -  10/29/17 1052 102/73 - - (!) 58 10 - - -  10/29/17 1035 (!) 125/113 97.9 F (36.6 C) - 74 12 - - -  10/29/17 0703 - - - - - - 5\' 9"  (1.753 m) 112.9 kg (249 lb)       Recent laboratory studies: Recent Labs    10/30/17 0454  WBC 15.5*  HGB 13.3  HCT 40.2  PLT 205   Recent Labs    10/30/17 0454  NA 138  K 4.2  CL 105  CO2 24  BUN 24*  CREATININE 1.53*  GLUCOSE 112*  CALCIUM 9.4   Lab Results  Component Value Date   INR 1.07 03/29/2016     Recent Radiographic Studies :  No results found.  DISCHARGE INSTRUCTIONS: Discharge Instructions    CPM   Complete by:  As directed    Continuous passive motion machine (CPM):      Use the CPM from 0 to 90 for 4-6  hours per day.      You may increase by 10 per day.  You may break it up into 2 or 3 sessions per day.      Use CPM for 2 weeks or until you are told to stop.   Call MD / Call 911   Complete by:  As directed    If you experience chest pain or shortness of breath, CALL 911 and be transported to the hospital emergency room.  If you develope a fever above 101 F, pus (white drainage) or increased drainage or redness at the wound, or calf pain, call your surgeon's office.   Constipation Prevention   Complete by:  As directed    Drink plenty of fluids.  Prune juice may be helpful.  You may use a stool softener, such as Colace (over the counter) 100 mg twice a day.  Use MiraLax (over the counter) for constipation as needed.   Diet - low sodium heart healthy   Complete by:  As directed    Discharge instructions   Complete by:  As directed    INSTRUCTIONS AFTER JOINT REPLACEMENT   Remove items at home which could result in a fall. This includes throw rugs or furniture in walking pathways ICE to the affected joint every three hours while awake for 30 minutes at a time, for at least the first 3-5 days, and then as needed for pain and swelling.  Continue to use ice for pain and swelling. You may notice swelling that will progress down to the foot and ankle.  This is normal after surgery.  Elevate your leg when you are not up walking on it.   Continue to use the breathing machine you got in the hospital (incentive spirometer) which will help keep your temperature down.  It is common for your temperature to cycle up and down following surgery, especially at night when you are not up moving around and exerting yourself.  The breathing machine keeps your lungs expanded and your temperature down.   DIET:  As you were doing prior to hospitalization, we recommend a well-balanced diet.  DRESSING / WOUND CARE /  SHOWERING  Keep the surgical dressing until follow up.  The dressing is water proof, so you can shower  without any extra covering.  IF THE DRESSING FALLS OFF or the wound gets wet inside, change the dressing with sterile gauze.  Please use good hand washing techniques before changing the dressing.  Do not use any lotions or creams on the incision until instructed by your surgeon.    ACTIVITY  Increase activity slowly as tolerated, but follow the weight bearing instructions below.   No driving for 6 weeks or until further direction given by your physician.  You cannot drive while taking narcotics.  No lifting or carrying greater than 10 lbs. until further directed by your surgeon. Avoid periods of inactivity such as sitting longer than an hour when not asleep. This helps prevent blood clots.  You may return to work once you are authorized by your doctor.     WEIGHT BEARING   Weight bearing as tolerated with assist device (walker, cane, etc) as directed, use it as long as suggested by your surgeon or therapist, typically at least 4-6 weeks.   EXERCISES  Results after joint replacement surgery are often greatly improved when you follow the exercise, range of motion and muscle strengthening exercises prescribed by your doctor. Safety measures are also important to protect the joint from further injury. Any time any of these exercises cause you to have increased pain or swelling, decrease what you are doing until you are comfortable again and then slowly increase them. If you have problems or questions, call your caregiver or physical therapist for advice.   Rehabilitation is important following a joint replacement. After just a few days of immobilization, the muscles of the leg can become weakened and shrink (atrophy).  These exercises are designed to build up the tone and strength of the thigh and leg muscles and to improve motion. Often times heat used for twenty to thirty minutes before working out will loosen up your tissues and help with improving the range of motion but do not use heat for the  first two weeks following surgery (sometimes heat can increase post-operative swelling).   These exercises can be done on a training (exercise) mat, on the floor, on a table or on a bed. Use whatever works the best and is most comfortable for you.    Use music or television while you are exercising so that the exercises are a pleasant break in your day. This will make your life better with the exercises acting as a break in your routine that you can look forward to.   Perform all exercises about fifteen times, three times per day or as directed.  You should exercise both the operative leg and the other leg as well.   Exercises include:   Quad Sets - Tighten up the muscle on the front of the thigh (Quad) and hold for 5-10 seconds.   Straight Leg Raises - With your knee straight (if you were given a brace, keep it on), lift the leg to 60 degrees, hold for 3 seconds, and slowly lower the leg.  Perform this exercise against resistance later as your leg gets stronger.  Leg Slides: Lying on your back, slowly slide your foot toward your buttocks, bending your knee up off the floor (only go as far as is comfortable). Then slowly slide your foot back down until your leg is flat on the floor again.  Angel Wings: Lying on your back spread your legs to  the side as far apart as you can without causing discomfort.  Hamstring Strength:  Lying on your back, push your heel against the floor with your leg straight by tightening up the muscles of your buttocks.  Repeat, but this time bend your knee to a comfortable angle, and push your heel against the floor.  You may put a pillow under the heel to make it more comfortable if necessary.   A rehabilitation program following joint replacement surgery can speed recovery and prevent re-injury in the future due to weakened muscles. Contact your doctor or a physical therapist for more information on knee rehabilitation.    CONSTIPATION  Constipation is defined medically as  fewer than three stools per week and severe constipation as less than one stool per week.  Even if you have a regular bowel pattern at home, your normal regimen is likely to be disrupted due to multiple reasons following surgery.  Combination of anesthesia, postoperative narcotics, change in appetite and fluid intake all can affect your bowels.   YOU MUST use at least one of the following options; they are listed in order of increasing strength to get the job done.  They are all available over the counter, and you may need to use some, POSSIBLY even all of these options:    Drink plenty of fluids (prune juice may be helpful) and high fiber foods Colace 100 mg by mouth twice a day  Senokot for constipation as directed and as needed Dulcolax (bisacodyl), take with full glass of water  Miralax (polyethylene glycol) once or twice a day as needed.  If you have tried all these things and are unable to have a bowel movement in the first 3-4 days after surgery call either your surgeon or your primary doctor.    If you experience loose stools or diarrhea, hold the medications until you stool forms back up.  If your symptoms do not get better within 1 week or if they get worse, check with your doctor.  If you experience "the worst abdominal pain ever" or develop nausea or vomiting, please contact the office immediately for further recommendations for treatment.   ITCHING:  If you experience itching with your medications, try taking only a single pain pill, or even half a pain pill at a time.  You can also use Benadryl over the counter for itching or also to help with sleep.   TED HOSE STOCKINGS:  Use stockings on both legs until for at least 2 weeks or as directed by physician office. They may be removed at night for sleeping.  MEDICATIONS:  See your medication summary on the "After Visit Summary" that nursing will review with you.  You may have some home medications which will be placed on hold until you  complete the course of blood thinner medication.  It is important for you to complete the blood thinner medication as prescribed.  PRECAUTIONS:  If you experience chest pain or shortness of breath - call 911 immediately for transfer to the hospital emergency department.   If you develop a fever greater that 101 F, purulent drainage from wound, increased redness or drainage from wound, foul odor from the wound/dressing, or calf pain - CONTACT YOUR SURGEON.  FOLLOW-UP APPOINTMENTS:  If you do not already have a post-op appointment, please call the office for an appointment to be seen by your surgeon.  Guidelines for how soon to be seen are listed in your "After Visit Summary", but are typically between 1-4 weeks after surgery.  OTHER INSTRUCTIONS:   Knee Replacement:  Do not place pillow under knee, focus on keeping the knee straight while resting. CPM instructions: 0-90 degrees, 2 hours in the morning, 2 hours in the afternoon, and 2 hours in the evening. Place foam block, curve side up under heel at all times except when in CPM or when walking.  DO NOT modify, tear, cut, or change the foam block in any way.  MAKE SURE YOU:  Understand these instructions.  Get help right away if you are not doing well or get worse.    Thank you for letting us be a part of your medical care team.  It is a privilege we respect greatly.  We hope these instructions will help you stay on track for a fast and full recovery!   Increase activity slowly as tolerated   Complete by:  As directed    TED hose   Complete by:  As directed    Use stockings (TED hose) for 2 weeks on both leg(s).  You may remove them at night for sleeping.      DISCHARGE MEDICATIONS:   Allergies as of 10/30/2017   No Known Allergies     Medication List    STOP taking these medications   naproxen sodium 220 MG tablet Commonly known as:  ALEVE     TAKE these medications   amLODipine  2.5 MG tablet Commonly known as:  NORVASC Take 2.5 mg by mouth every evening.   aspirin 325 MG EC tablet Take 1 tablet (325 mg total) by mouth 2 (two) times daily. What changed:    medication strength  how much to take  when to take this   atorvastatin 10 MG tablet Commonly known as:  LIPITOR Take 10 mg by mouth every evening.   azelastine 0.1 % nasal spray Commonly known as:  ASTELIN Place 1 spray into both nostrils daily at 12 noon. Use in each nostril as directed   fluticasone 50 MCG/ACT nasal spray Commonly known as:  FLONASE Place 1 spray into both nostrils every evening.   gabapentin 100 MG capsule Commonly known as:  NEURONTIN Take 300 mg by mouth every evening.   losartan 100 MG tablet Commonly known as:  COZAAR Take 100 mg by mouth every evening.   LYSINE PO Take 1,000 mg by mouth every evening.   methocarbamol 500 MG tablet Commonly known as:  ROBAXIN Take 1-2 tablets (500-1,000 mg total) by mouth every 6 (six) hours as needed for muscle spasms.   oxyCODONE 5 MG immediate release tablet Commonly known as:  Oxy IR/ROXICODONE Take 1-2 tablets (5-10 mg total) by mouth every 6 (six) hours as needed for moderate pain (pain score 4-6).            Durable Medical Equipment  (From admission, onward)        Start     Ordered   10/29/17 1218  DME Walker rolling  Once    Question:  Patient needs a walker to treat with the following condition  Answer:  S/P total knee replacement   10/29/17 1217   10/29/17 1218  DME 3 n 1  Once     10/29/17 1217   10/29/17  1218  DME Bedside commode  Once    Question:  Patient needs a bedside commode to treat with the following condition  Answer:  S/P total knee replacement   10/29/17 1217      FOLLOW UP VISIT:    DISPOSITION: HOME VS. SNF  CONDITION:  Good   Donia Ast 10/30/2017, 6:59 AM

## 2017-10-30 NOTE — Care Management Note (Signed)
Case Management Note  Patient Details  Name: Jesse Ali MRN: [id number] Date of Birth: 05-27-1950  Subjective/Objective:   67 yr old gentleman s/p right total knee arthroplasty.                 Action/Plan: Patient was preoperatively setup with [facility], no changes. He has RW and 3in1 from previous surgery. Will have family support at discharge.    Expected Discharge Date:  10/30/17               Expected Discharge Plan:  [place]  In-House Referral:  NA  Discharge planning Services  CM Consult  Post Acute Care Choice:  Home Health, Durable Medical Equipment Choice offered to:  Patient  DME Arranged:  CPM(has RW and 3in1) DME Agency:  [employer]  HH Arranged:  PT [place] Agency:  [facility] (formerly [employer])  Status of Service:  Completed, signed off  If discussed at [employer], dates discussed:    Additional Comments:  Ninfa Meeker, RN 10/30/2017, 10:13 AM

## 2017-10-30 NOTE — Progress Notes (Signed)
Physical Therapy Treatment Patient Details Name: Jesse Ali MRN: [id number] DOB: 09-14-1950 Today's Date: 10/30/2017    History of Present Illness Pt is a 67 y/o male s/p elective R TKA. PMH includes CKD, HTN, L TKA, and OSA on CPAP.     PT Comments    Pt performed gait, stair and functional mobility training to prepare for d/c home.  PTA reviewed HEP and educated on frequency.  Pt to return home with support from his wife.  He is ready to d/c home at this time.     Follow Up Recommendations  Follow surgeon's recommendation for DC plan and follow-up therapies;Supervision for mobility/OOB     Equipment Recommendations  None recommended by PT(refused 3:1)    Recommendations for Other Services       Precautions / Restrictions Precautions Precautions: Knee Precaution Booklet Issued: Yes (comment) Precaution Comments: REviewed knee precautions and supine HEP.  Restrictions Weight Bearing Restrictions: Yes RLE Weight Bearing: Weight bearing as tolerated    Mobility  Bed Mobility               General bed mobility comments: Pt in recliner on arrival  Transfers Overall transfer level: Needs assistance Equipment used: Rolling walker (2 wheeled) Transfers: Sit to/from Stand Sit to Stand: Supervision         General transfer comment: Cues for hand placement to and from seated surface.    Ambulation/Gait Ambulation/Gait assistance: Supervision Gait Distance (Feet): 200 Feet(+ 80 feet.  ) Assistive device: Rolling walker (2 wheeled) Gait Pattern/deviations: Step-through pattern;Antalgic;Decreased stance time - right;Trunk flexed Gait velocity: Decreased    General Gait Details: Adjusted RW height for improved fit and cues for upper trunk control, weight shifting R and symmetry with sequencing.     Stairs Stairs: Yes Stairs assistance: Min assist;Min guard Stair Management: No rails;With walker;Backwards;Forwards Number of Stairs: 4 General stair comments: Pt  performed curb training x2 reps with cues for RW placement and sequencing.  Pt performed 2 stairs with no rails and use of RW to [place] backwards with min assistance to brace RW and cues for sequencing.     Wheelchair Mobility    Modified Rankin (Stroke Patients Only)       Balance Overall balance assessment: Needs assistance Sitting-balance support: No upper extremity supported;Feet supported Sitting balance-Leahy Scale: Good     Standing balance support: Bilateral upper extremity supported;During functional activity Standing balance-Leahy Scale: Poor Standing balance comment: Reliant on BUE support.                             Cognition Arousal/Alertness: Awake/alert Behavior During Therapy: WFL for tasks assessed/performed Overall Cognitive Status: Within Functional Limits for tasks assessed                                        Exercises Total Joint Exercises Ankle Circles/Pumps: AROM;Both;20 reps Quad Sets: AROM;Right;10 reps Towel Squeeze: AROM;Both;10 reps;Supine Short Arc Quad: AROM;Right;10 reps;Supine Heel Slides: Right;10 reps;Supine;AAROM Hip ABduction/ADduction: AROM;10 reps;Right;Supine Straight Leg Raises: Right;10 reps;Supine;AAROM Goniometric ROM: 10-90 degrees.      General Comments        Pertinent Vitals/Pain Pain Assessment: 0-10 Pain Score: 8  Pain Location: R knee  Pain Descriptors / Indicators: Aching;Operative site guarding Pain Intervention(s): Monitored during session;Repositioned    Home Living  Prior Function            PT Goals (current goals can now be found in the care plan section) Acute Rehab PT Goals Patient Stated Goal: to go home tomorrow  Potential to Achieve Goals: Good Progress towards PT goals: Progressing toward goals    Frequency    7X/week      PT Plan Current plan remains appropriate    Co-evaluation              AM-PAC PT "6 Clicks"  Daily Activity  Outcome Measure  Difficulty turning over in bed (including adjusting bedclothes, sheets and blankets)?: A Little Difficulty moving from lying on back to sitting on the side of the bed? : A Little Difficulty sitting down on and standing up from a chair with arms (e.g., wheelchair, bedside commode, etc,.)?: Unable Help needed moving to and from a bed to chair (including a wheelchair)?: A Little Help needed walking in hospital room?: A Little Help needed climbing 3-5 steps with a railing? : A Lot 6 Click Score: 15    End of Session Equipment Utilized During Treatment: Gait belt Activity Tolerance: Patient tolerated treatment well Patient left: in chair;with call bell/phone within reach Nurse Communication: Mobility status PT Visit Diagnosis: Other abnormalities of gait and mobility (R26.89);Pain Pain - Right/Left: Right Pain - part of body: Knee     Time: 6067-7034 PT Time Calculation (min) (ACUTE ONLY): 23 min  Charges:  $Gait Training: 8-22 mins $Therapeutic Exercise: 8-22 mins                    G Codes:       Governor Rooks, PTA pager [phone]    Cristela Blue 10/30/2017, 10:47 AM

## 2017-10-30 NOTE — Plan of Care (Signed)
  Problem: Pain Managment: Goal: General experience of comfort will improve Outcome: Progressing   

## 2017-10-30 NOTE — Progress Notes (Addendum)
SPORTS MEDICINE AND JOINT REPLACEMENT  Lara Mulch, MD    Carlyon Shadow, PA-C [place], [place], [place]  [address]                             [phone]   PROGRESS NOTE  Subjective:  negative for Chest Pain  negative for Shortness of Breath  negative for Nausea/Vomiting   negative for Calf Pain  negative for Bowel Movement   Tolerating Diet: yes         Patient reports pain as 3 on 0-10 scale.    Objective: Vital signs in last 24 hours:    Patient Vitals for the past 24 hrs:  BP Temp Temp src Pulse Resp SpO2 Height Weight  10/30/17 0514 132/76 98.1 F (36.7 C) Oral 64 16 99 % - -  10/29/17 2326 125/72 98 F (36.7 C) Oral 75 16 95 % - -  10/29/17 2000 - - - - 16 - - -  10/29/17 1225 131/85 (!) 97.5 F (36.4 C) Oral (!) 58 16 100 % - -  10/29/17 1206 - (!) 97.5 F (36.4 C) - (!) 57 15 98 % - -  10/29/17 1154 129/79 - - (!) 55 16 100 % - -  10/29/17 1108 115/90 - - (!) 52 15 - - -  10/29/17 1052 102/73 - - (!) 58 10 - - -  10/29/17 1035 (!) 125/113 97.9 F (36.6 C) - 74 12 - - -  10/29/17 0703 - - - - - - 5\' 9"  (1.753 m) 112.9 kg (249 lb)    @flow {1959:LAST@   Intake/Output from previous day:   07/15 0701 - 07/16 0700 In: 1480 [P.O.:480; I.V.:1000] Out: 2310 [Urine:2300]   Intake/Output this shift:   07/15 1901 - 07/16 0700 In: -  Out: 775 [Urine:775]   Intake/Output      07/15 0701 - 07/16 0700   P.O. 480   I.V. (mL/kg) 1000 (8.9)   Total Intake(mL/kg) 1480 (13.1)   Urine (mL/kg/hr) 2300 (0.8)   Blood 10   Total Output 2310   Net -830          LABORATORY DATA: Recent Labs    10/30/17 0454  WBC 15.5*  HGB 13.3  HCT 40.2  PLT 205   Recent Labs    10/30/17 0454  NA 138  K 4.2  CL 105  CO2 24  BUN 24*  CREATININE 1.53*  GLUCOSE 112*  CALCIUM 9.4   Lab Results  Component Value Date   INR 1.07 03/29/2016    Examination:  General appearance: alert, cooperative and no distress Extremities: extremities normal,  atraumatic, no cyanosis or edema  Wound Exam: clean, dry, intact   Drainage:  None: wound tissue dry  Motor Exam: Quadriceps and Hamstrings Intact  Sensory Exam: Superficial Peroneal, Deep Peroneal and Tibial normal   Assessment:    1 Day Post-Op  Procedure(s) (LRB): RIGHT TOTAL KNEE ARTHROPLASTY (Right)  ADDITIONAL DIAGNOSIS:  Active Problems:   S/P total knee replacement     Plan: Physical Therapy as ordered Weight Bearing as Tolerated (WBAT)  DVT Prophylaxis:  Aspirin  DISCHARGE PLAN: Home  DISCHARGE NEEDS: HHPT   Patient doing well and is ready for discharge      Patient's anticipated LOS is less than 2 midnights, meeting these requirements: - Lives within 1 hour of care - Has a competent adult at home to recover with post-op recover -  NO history of  - Chronic pain requiring opiods  - Diabetes  - Coronary Artery Disease  - Heart failure  - Heart attack  - Stroke  - DVT/VTE  - Cardiac arrhythmia  - Respiratory Failure/COPD  - Renal failure  - Anemia  - Advanced Liver disease         Donia Ast 10/30/2017, 6:55 AM

## 2017-10-30 NOTE — Progress Notes (Signed)
Discharge instructions completed with pt. Pt verbalized understanding of the information.  Pt denies chest pain, shortness of breath, dizziness, lightheadedness, and n/v.  Pt discharged home.  

## 2017-10-31 DIAGNOSIS — E785 Hyperlipidemia, unspecified: Secondary | ICD-10-CM | POA: Diagnosis not present

## 2017-10-31 DIAGNOSIS — Z87891 Personal history of nicotine dependence: Secondary | ICD-10-CM | POA: Diagnosis not present

## 2017-10-31 DIAGNOSIS — Z96653 Presence of artificial knee joint, bilateral: Secondary | ICD-10-CM | POA: Diagnosis not present

## 2017-10-31 DIAGNOSIS — G4733 Obstructive sleep apnea (adult) (pediatric): Secondary | ICD-10-CM | POA: Diagnosis not present

## 2017-10-31 DIAGNOSIS — Z471 Aftercare following joint replacement surgery: Secondary | ICD-10-CM | POA: Diagnosis not present

## 2017-10-31 DIAGNOSIS — I129 Hypertensive chronic kidney disease with stage 1 through stage 4 chronic kidney disease, or unspecified chronic kidney disease: Secondary | ICD-10-CM | POA: Diagnosis not present

## 2017-10-31 DIAGNOSIS — Z7982 Long term (current) use of aspirin: Secondary | ICD-10-CM | POA: Diagnosis not present

## 2017-10-31 DIAGNOSIS — Z9181 History of falling: Secondary | ICD-10-CM | POA: Diagnosis not present

## 2017-10-31 DIAGNOSIS — N183 Chronic kidney disease, stage 3 (moderate): Secondary | ICD-10-CM | POA: Diagnosis not present

## 2017-11-03 DIAGNOSIS — Z471 Aftercare following joint replacement surgery: Secondary | ICD-10-CM | POA: Diagnosis not present

## 2017-11-03 DIAGNOSIS — Z9181 History of falling: Secondary | ICD-10-CM | POA: Diagnosis not present

## 2017-11-03 DIAGNOSIS — G4733 Obstructive sleep apnea (adult) (pediatric): Secondary | ICD-10-CM | POA: Diagnosis not present

## 2017-11-03 DIAGNOSIS — Z87891 Personal history of nicotine dependence: Secondary | ICD-10-CM | POA: Diagnosis not present

## 2017-11-03 DIAGNOSIS — E785 Hyperlipidemia, unspecified: Secondary | ICD-10-CM | POA: Diagnosis not present

## 2017-11-03 DIAGNOSIS — Z96653 Presence of artificial knee joint, bilateral: Secondary | ICD-10-CM | POA: Diagnosis not present

## 2017-11-03 DIAGNOSIS — Z7982 Long term (current) use of aspirin: Secondary | ICD-10-CM | POA: Diagnosis not present

## 2017-11-03 DIAGNOSIS — I129 Hypertensive chronic kidney disease with stage 1 through stage 4 chronic kidney disease, or unspecified chronic kidney disease: Secondary | ICD-10-CM | POA: Diagnosis not present

## 2017-11-03 DIAGNOSIS — N183 Chronic kidney disease, stage 3 (moderate): Secondary | ICD-10-CM | POA: Diagnosis not present

## 2017-11-08 DIAGNOSIS — M62551 Muscle wasting and atrophy, not elsewhere classified, right thigh: Secondary | ICD-10-CM | POA: Diagnosis not present

## 2017-11-08 DIAGNOSIS — M25461 Effusion, right knee: Secondary | ICD-10-CM | POA: Diagnosis not present

## 2017-11-08 DIAGNOSIS — M25561 Pain in right knee: Secondary | ICD-10-CM | POA: Diagnosis not present

## 2017-11-08 DIAGNOSIS — Z96651 Presence of right artificial knee joint: Secondary | ICD-10-CM | POA: Diagnosis not present

## 2017-11-08 DIAGNOSIS — R2689 Other abnormalities of gait and mobility: Secondary | ICD-10-CM | POA: Diagnosis not present

## 2017-11-12 DIAGNOSIS — M25561 Pain in right knee: Secondary | ICD-10-CM | POA: Diagnosis not present

## 2017-11-12 DIAGNOSIS — M62551 Muscle wasting and atrophy, not elsewhere classified, right thigh: Secondary | ICD-10-CM | POA: Diagnosis not present

## 2017-11-12 DIAGNOSIS — R2689 Other abnormalities of gait and mobility: Secondary | ICD-10-CM | POA: Diagnosis not present

## 2017-11-12 DIAGNOSIS — M25461 Effusion, right knee: Secondary | ICD-10-CM | POA: Diagnosis not present

## 2017-11-12 DIAGNOSIS — Z96651 Presence of right artificial knee joint: Secondary | ICD-10-CM | POA: Diagnosis not present

## 2017-11-14 DIAGNOSIS — I10 Essential (primary) hypertension: Secondary | ICD-10-CM | POA: Diagnosis not present

## 2017-11-14 DIAGNOSIS — M25561 Pain in right knee: Secondary | ICD-10-CM | POA: Diagnosis not present

## 2017-11-14 DIAGNOSIS — N183 Chronic kidney disease, stage 3 (moderate): Secondary | ICD-10-CM | POA: Diagnosis not present

## 2017-11-14 DIAGNOSIS — M25461 Effusion, right knee: Secondary | ICD-10-CM | POA: Diagnosis not present

## 2017-11-14 DIAGNOSIS — M62551 Muscle wasting and atrophy, not elsewhere classified, right thigh: Secondary | ICD-10-CM | POA: Diagnosis not present

## 2017-11-14 DIAGNOSIS — E785 Hyperlipidemia, unspecified: Secondary | ICD-10-CM | POA: Diagnosis not present

## 2017-11-14 DIAGNOSIS — R2689 Other abnormalities of gait and mobility: Secondary | ICD-10-CM | POA: Diagnosis not present

## 2017-11-14 DIAGNOSIS — Z96651 Presence of right artificial knee joint: Secondary | ICD-10-CM | POA: Diagnosis not present

## 2017-11-16 DIAGNOSIS — M62551 Muscle wasting and atrophy, not elsewhere classified, right thigh: Secondary | ICD-10-CM | POA: Diagnosis not present

## 2017-11-16 DIAGNOSIS — M25461 Effusion, right knee: Secondary | ICD-10-CM | POA: Diagnosis not present

## 2017-11-16 DIAGNOSIS — R2689 Other abnormalities of gait and mobility: Secondary | ICD-10-CM | POA: Diagnosis not present

## 2017-11-16 DIAGNOSIS — Z96651 Presence of right artificial knee joint: Secondary | ICD-10-CM | POA: Diagnosis not present

## 2017-11-16 DIAGNOSIS — M25561 Pain in right knee: Secondary | ICD-10-CM | POA: Diagnosis not present

## 2017-11-19 DIAGNOSIS — R2689 Other abnormalities of gait and mobility: Secondary | ICD-10-CM | POA: Diagnosis not present

## 2017-11-19 DIAGNOSIS — M25561 Pain in right knee: Secondary | ICD-10-CM | POA: Diagnosis not present

## 2017-11-19 DIAGNOSIS — M62551 Muscle wasting and atrophy, not elsewhere classified, right thigh: Secondary | ICD-10-CM | POA: Diagnosis not present

## 2017-11-19 DIAGNOSIS — Z96651 Presence of right artificial knee joint: Secondary | ICD-10-CM | POA: Diagnosis not present

## 2017-11-19 DIAGNOSIS — M25461 Effusion, right knee: Secondary | ICD-10-CM | POA: Diagnosis not present

## 2017-11-23 DIAGNOSIS — Z96651 Presence of right artificial knee joint: Secondary | ICD-10-CM | POA: Diagnosis not present

## 2017-11-23 DIAGNOSIS — M62551 Muscle wasting and atrophy, not elsewhere classified, right thigh: Secondary | ICD-10-CM | POA: Diagnosis not present

## 2017-11-23 DIAGNOSIS — R2689 Other abnormalities of gait and mobility: Secondary | ICD-10-CM | POA: Diagnosis not present

## 2017-11-23 DIAGNOSIS — M25461 Effusion, right knee: Secondary | ICD-10-CM | POA: Diagnosis not present

## 2017-11-23 DIAGNOSIS — M25561 Pain in right knee: Secondary | ICD-10-CM | POA: Diagnosis not present

## 2017-11-26 DIAGNOSIS — M62551 Muscle wasting and atrophy, not elsewhere classified, right thigh: Secondary | ICD-10-CM | POA: Diagnosis not present

## 2017-11-26 DIAGNOSIS — M25561 Pain in right knee: Secondary | ICD-10-CM | POA: Diagnosis not present

## 2017-11-26 DIAGNOSIS — R2689 Other abnormalities of gait and mobility: Secondary | ICD-10-CM | POA: Diagnosis not present

## 2017-11-26 DIAGNOSIS — Z96651 Presence of right artificial knee joint: Secondary | ICD-10-CM | POA: Diagnosis not present

## 2017-11-26 DIAGNOSIS — M25461 Effusion, right knee: Secondary | ICD-10-CM | POA: Diagnosis not present

## 2017-11-29 DIAGNOSIS — M25461 Effusion, right knee: Secondary | ICD-10-CM | POA: Diagnosis not present

## 2017-11-29 DIAGNOSIS — R2689 Other abnormalities of gait and mobility: Secondary | ICD-10-CM | POA: Diagnosis not present

## 2017-11-29 DIAGNOSIS — M25561 Pain in right knee: Secondary | ICD-10-CM | POA: Diagnosis not present

## 2017-11-29 DIAGNOSIS — Z96651 Presence of right artificial knee joint: Secondary | ICD-10-CM | POA: Diagnosis not present

## 2017-11-29 DIAGNOSIS — M62551 Muscle wasting and atrophy, not elsewhere classified, right thigh: Secondary | ICD-10-CM | POA: Diagnosis not present

## 2017-12-03 DIAGNOSIS — M25561 Pain in right knee: Secondary | ICD-10-CM | POA: Diagnosis not present

## 2017-12-03 DIAGNOSIS — Z96651 Presence of right artificial knee joint: Secondary | ICD-10-CM | POA: Diagnosis not present

## 2017-12-03 DIAGNOSIS — R2689 Other abnormalities of gait and mobility: Secondary | ICD-10-CM | POA: Diagnosis not present

## 2017-12-03 DIAGNOSIS — M25461 Effusion, right knee: Secondary | ICD-10-CM | POA: Diagnosis not present

## 2017-12-03 DIAGNOSIS — M62551 Muscle wasting and atrophy, not elsewhere classified, right thigh: Secondary | ICD-10-CM | POA: Diagnosis not present

## 2017-12-06 DIAGNOSIS — M7542 Impingement syndrome of left shoulder: Secondary | ICD-10-CM | POA: Diagnosis not present

## 2017-12-06 DIAGNOSIS — M25512 Pain in left shoulder: Secondary | ICD-10-CM | POA: Diagnosis not present

## 2017-12-06 DIAGNOSIS — M25812 Other specified joint disorders, left shoulder: Secondary | ICD-10-CM | POA: Diagnosis not present

## 2017-12-06 DIAGNOSIS — G8929 Other chronic pain: Secondary | ICD-10-CM | POA: Diagnosis not present

## 2017-12-10 DIAGNOSIS — G8929 Other chronic pain: Secondary | ICD-10-CM | POA: Diagnosis not present

## 2017-12-10 DIAGNOSIS — M19012 Primary osteoarthritis, left shoulder: Secondary | ICD-10-CM | POA: Diagnosis not present

## 2017-12-10 DIAGNOSIS — M25512 Pain in left shoulder: Secondary | ICD-10-CM | POA: Diagnosis not present

## 2017-12-18 DIAGNOSIS — G8929 Other chronic pain: Secondary | ICD-10-CM | POA: Diagnosis not present

## 2017-12-18 DIAGNOSIS — M7542 Impingement syndrome of left shoulder: Secondary | ICD-10-CM | POA: Diagnosis not present

## 2018-01-02 DIAGNOSIS — M24112 Other articular cartilage disorders, left shoulder: Secondary | ICD-10-CM | POA: Diagnosis not present

## 2018-01-02 DIAGNOSIS — M94212 Chondromalacia, left shoulder: Secondary | ICD-10-CM | POA: Diagnosis not present

## 2018-01-02 DIAGNOSIS — G8918 Other acute postprocedural pain: Secondary | ICD-10-CM | POA: Diagnosis not present

## 2018-01-02 DIAGNOSIS — M7542 Impingement syndrome of left shoulder: Secondary | ICD-10-CM | POA: Diagnosis not present

## 2018-01-02 DIAGNOSIS — M19012 Primary osteoarthritis, left shoulder: Secondary | ICD-10-CM | POA: Diagnosis not present

## 2018-01-10 DIAGNOSIS — G4733 Obstructive sleep apnea (adult) (pediatric): Secondary | ICD-10-CM | POA: Diagnosis not present

## 2018-01-11 DIAGNOSIS — J301 Allergic rhinitis due to pollen: Secondary | ICD-10-CM | POA: Diagnosis not present

## 2018-01-11 DIAGNOSIS — G4733 Obstructive sleep apnea (adult) (pediatric): Secondary | ICD-10-CM | POA: Diagnosis not present

## 2018-01-11 DIAGNOSIS — G4752 REM sleep behavior disorder: Secondary | ICD-10-CM | POA: Diagnosis not present

## 2018-02-09 DIAGNOSIS — G4733 Obstructive sleep apnea (adult) (pediatric): Secondary | ICD-10-CM | POA: Diagnosis not present

## 2018-05-20 DIAGNOSIS — E785 Hyperlipidemia, unspecified: Secondary | ICD-10-CM | POA: Diagnosis not present

## 2018-05-20 DIAGNOSIS — Z79899 Other long term (current) drug therapy: Secondary | ICD-10-CM | POA: Diagnosis not present

## 2018-05-20 DIAGNOSIS — Z125 Encounter for screening for malignant neoplasm of prostate: Secondary | ICD-10-CM | POA: Diagnosis not present

## 2018-05-23 DIAGNOSIS — G4733 Obstructive sleep apnea (adult) (pediatric): Secondary | ICD-10-CM | POA: Diagnosis not present

## 2018-05-24 DIAGNOSIS — Z1331 Encounter for screening for depression: Secondary | ICD-10-CM | POA: Diagnosis not present

## 2018-05-24 DIAGNOSIS — Z1339 Encounter for screening examination for other mental health and behavioral disorders: Secondary | ICD-10-CM | POA: Diagnosis not present

## 2018-05-24 DIAGNOSIS — G4730 Sleep apnea, unspecified: Secondary | ICD-10-CM | POA: Diagnosis not present

## 2018-05-24 DIAGNOSIS — Z6837 Body mass index (BMI) 37.0-37.9, adult: Secondary | ICD-10-CM | POA: Diagnosis not present

## 2018-05-24 DIAGNOSIS — Z23 Encounter for immunization: Secondary | ICD-10-CM | POA: Diagnosis not present

## 2018-05-24 DIAGNOSIS — I10 Essential (primary) hypertension: Secondary | ICD-10-CM | POA: Diagnosis not present

## 2018-05-24 DIAGNOSIS — E785 Hyperlipidemia, unspecified: Secondary | ICD-10-CM | POA: Diagnosis not present

## 2018-05-24 DIAGNOSIS — N183 Chronic kidney disease, stage 3 (moderate): Secondary | ICD-10-CM | POA: Diagnosis not present

## 2018-05-24 DIAGNOSIS — Z0000 Encounter for general adult medical examination without abnormal findings: Secondary | ICD-10-CM | POA: Diagnosis not present

## 2018-05-24 DIAGNOSIS — Z2821 Immunization not carried out because of patient refusal: Secondary | ICD-10-CM | POA: Diagnosis not present

## 2018-05-24 DIAGNOSIS — Z9181 History of falling: Secondary | ICD-10-CM | POA: Diagnosis not present

## 2018-08-08 DIAGNOSIS — G4733 Obstructive sleep apnea (adult) (pediatric): Secondary | ICD-10-CM | POA: Diagnosis not present

## 2018-08-15 IMAGING — CR DG KNEE 1-2V*L*
2 series · 2 of 2 positions shown · non-contrast
Comparison: 02/08/2016

CLINICAL DATA: Postop

EXAM:
LEFT KNEE - 1-2 VIEW

[AP]
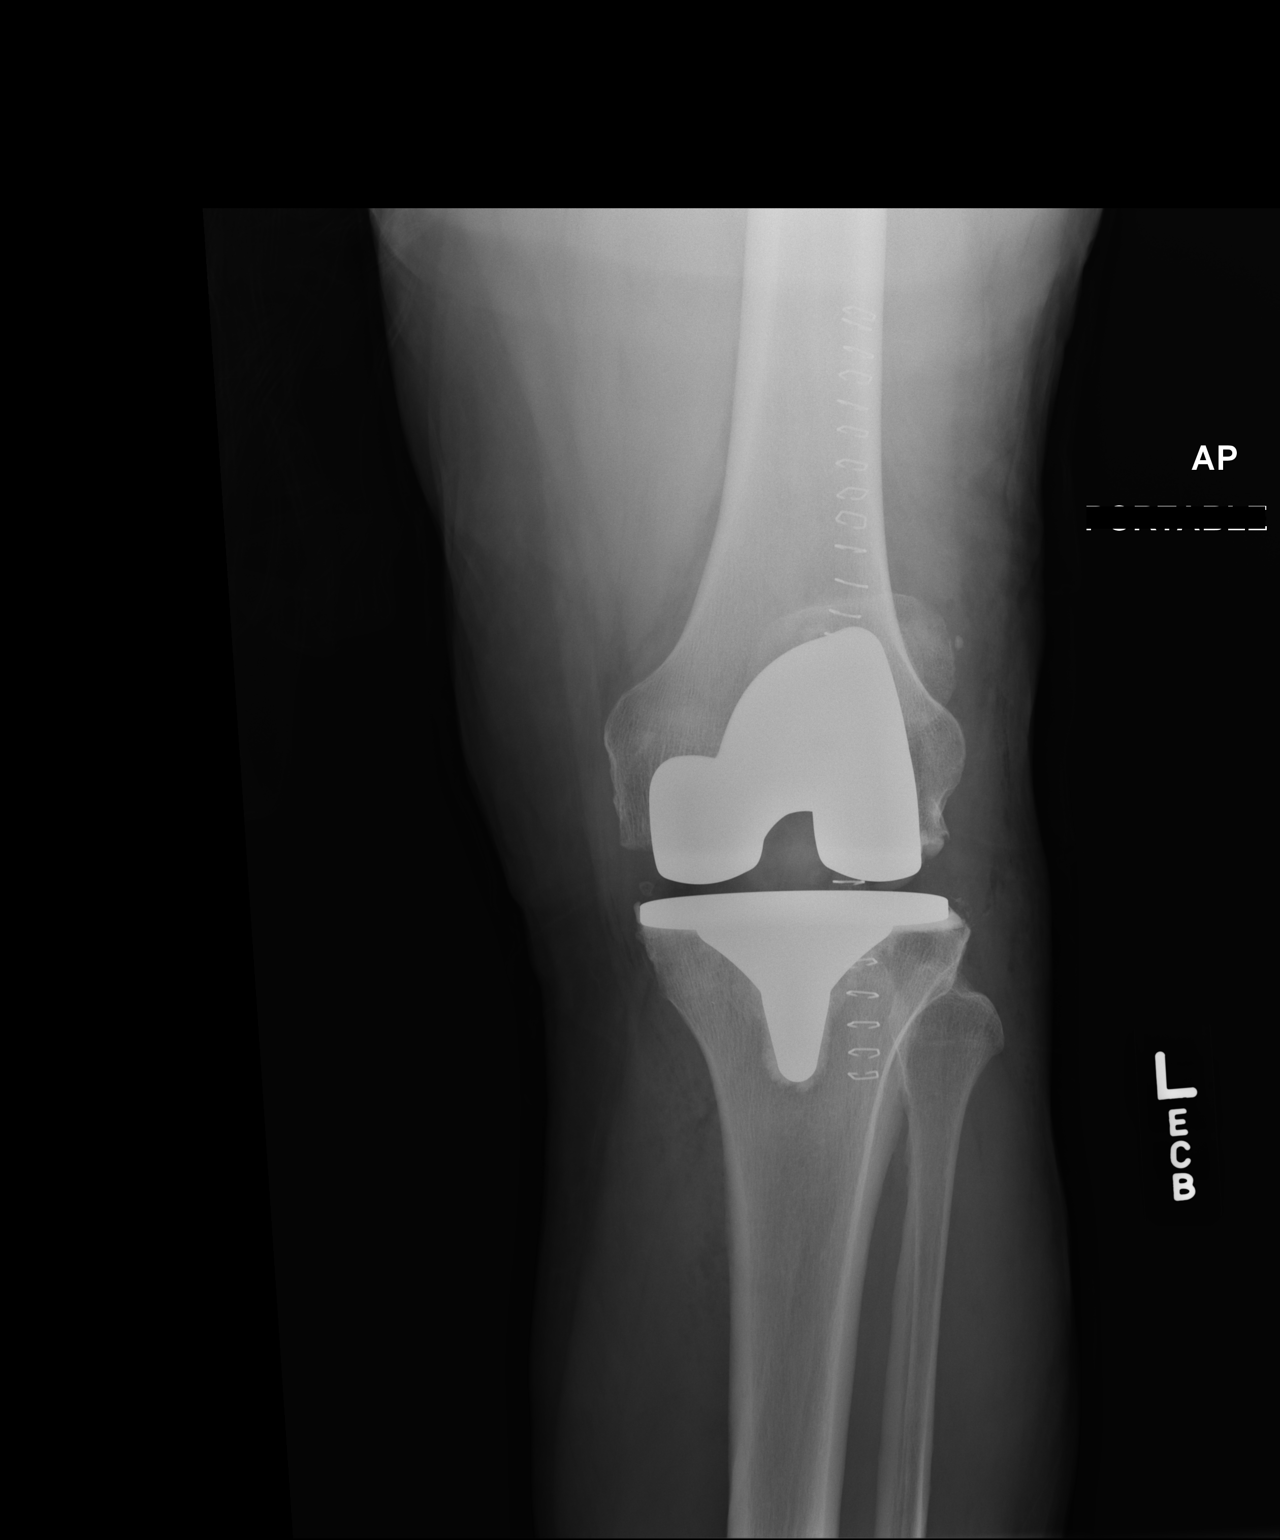

[lateral]
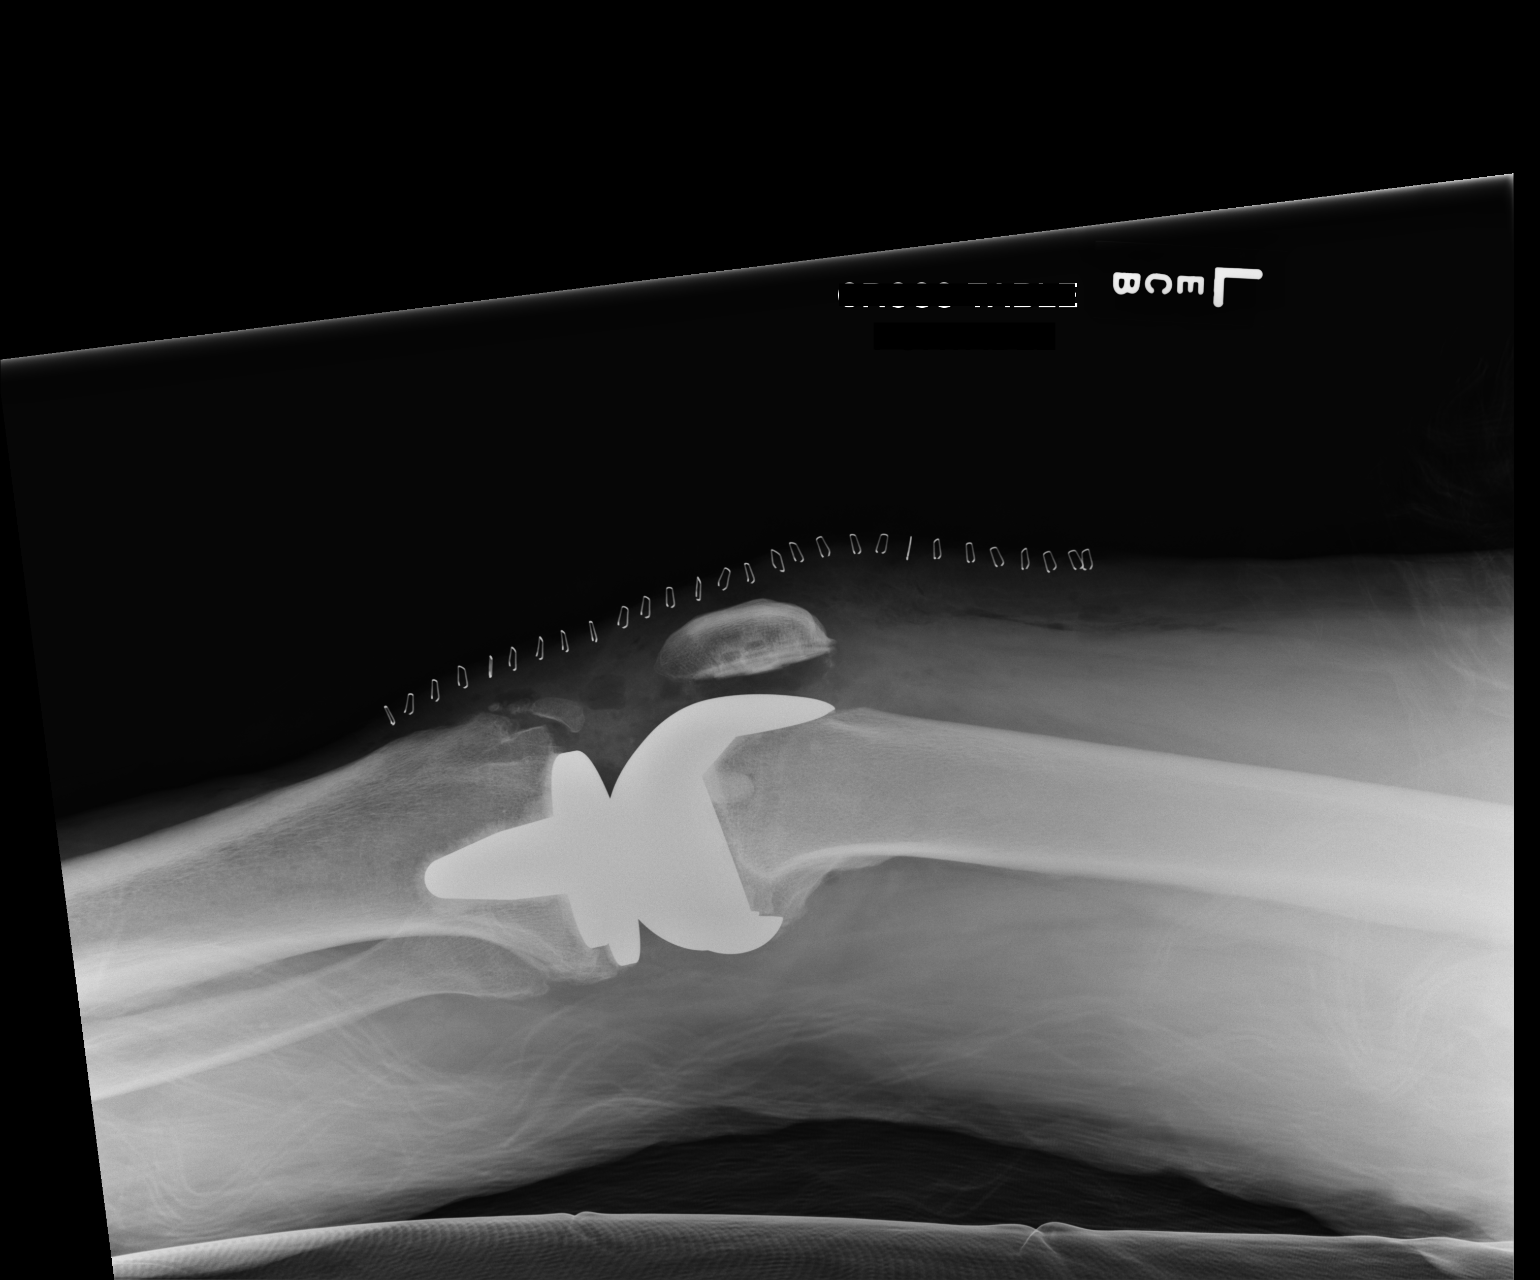

[2 of 2 positions shown; findings below may reference images not displayed]

FINDINGS: The patient is status post left knee replacement with satisfactory
alignment. Hardware appears intact. Soft tissue staples anteriorly.
Tibial tuberosity ossific opacities are unchanged. Small amount of
gas within the joint and soft tissues is felt postoperative.
IMPRESSION: Interim left knee replacement with satisfactory alignment and
expected postsurgical changes

## 2018-08-28 DIAGNOSIS — J301 Allergic rhinitis due to pollen: Secondary | ICD-10-CM | POA: Diagnosis not present

## 2018-08-28 DIAGNOSIS — G4733 Obstructive sleep apnea (adult) (pediatric): Secondary | ICD-10-CM | POA: Diagnosis not present

## 2018-08-28 DIAGNOSIS — G4752 REM sleep behavior disorder: Secondary | ICD-10-CM | POA: Diagnosis not present

## 2018-11-06 DIAGNOSIS — G4733 Obstructive sleep apnea (adult) (pediatric): Secondary | ICD-10-CM | POA: Diagnosis not present

## 2019-02-11 DIAGNOSIS — G4733 Obstructive sleep apnea (adult) (pediatric): Secondary | ICD-10-CM | POA: Diagnosis not present

## 2019-02-17 DIAGNOSIS — G4733 Obstructive sleep apnea (adult) (pediatric): Secondary | ICD-10-CM | POA: Diagnosis not present

## 2019-02-28 DIAGNOSIS — G4733 Obstructive sleep apnea (adult) (pediatric): Secondary | ICD-10-CM | POA: Diagnosis not present

## 2019-02-28 DIAGNOSIS — J301 Allergic rhinitis due to pollen: Secondary | ICD-10-CM | POA: Diagnosis not present

## 2019-02-28 DIAGNOSIS — G4752 REM sleep behavior disorder: Secondary | ICD-10-CM | POA: Diagnosis not present

## 2019-04-29 DIAGNOSIS — E785 Hyperlipidemia, unspecified: Secondary | ICD-10-CM | POA: Diagnosis not present

## 2019-04-29 DIAGNOSIS — Z125 Encounter for screening for malignant neoplasm of prostate: Secondary | ICD-10-CM | POA: Diagnosis not present

## 2019-04-29 DIAGNOSIS — I10 Essential (primary) hypertension: Secondary | ICD-10-CM | POA: Diagnosis not present

## 2019-04-29 DIAGNOSIS — Z79899 Other long term (current) drug therapy: Secondary | ICD-10-CM | POA: Diagnosis not present

## 2019-04-29 DIAGNOSIS — Z6839 Body mass index (BMI) 39.0-39.9, adult: Secondary | ICD-10-CM | POA: Diagnosis not present

## 2019-05-20 DIAGNOSIS — G4733 Obstructive sleep apnea (adult) (pediatric): Secondary | ICD-10-CM | POA: Diagnosis not present

## 2019-06-24 DIAGNOSIS — G4733 Obstructive sleep apnea (adult) (pediatric): Secondary | ICD-10-CM | POA: Diagnosis not present

## 2019-08-22 DIAGNOSIS — G4752 REM sleep behavior disorder: Secondary | ICD-10-CM | POA: Diagnosis not present

## 2019-08-22 DIAGNOSIS — G4733 Obstructive sleep apnea (adult) (pediatric): Secondary | ICD-10-CM | POA: Diagnosis not present

## 2019-08-22 DIAGNOSIS — J301 Allergic rhinitis due to pollen: Secondary | ICD-10-CM | POA: Diagnosis not present

## 2019-09-09 DIAGNOSIS — G4733 Obstructive sleep apnea (adult) (pediatric): Secondary | ICD-10-CM | POA: Diagnosis not present

## 2019-11-04 DIAGNOSIS — Z96652 Presence of left artificial knee joint: Secondary | ICD-10-CM | POA: Diagnosis not present

## 2019-11-04 DIAGNOSIS — M25562 Pain in left knee: Secondary | ICD-10-CM | POA: Diagnosis not present

## 2019-11-04 DIAGNOSIS — Z471 Aftercare following joint replacement surgery: Secondary | ICD-10-CM | POA: Diagnosis not present

## 2019-11-07 DIAGNOSIS — Z0000 Encounter for general adult medical examination without abnormal findings: Secondary | ICD-10-CM | POA: Diagnosis not present

## 2019-11-07 DIAGNOSIS — E785 Hyperlipidemia, unspecified: Secondary | ICD-10-CM | POA: Diagnosis not present

## 2019-11-07 DIAGNOSIS — I10 Essential (primary) hypertension: Secondary | ICD-10-CM | POA: Diagnosis not present

## 2019-11-07 DIAGNOSIS — Z01818 Encounter for other preprocedural examination: Secondary | ICD-10-CM | POA: Diagnosis not present

## 2019-11-07 DIAGNOSIS — Z6841 Body Mass Index (BMI) 40.0 and over, adult: Secondary | ICD-10-CM | POA: Diagnosis not present

## 2019-11-07 DIAGNOSIS — Z1331 Encounter for screening for depression: Secondary | ICD-10-CM | POA: Diagnosis not present

## 2019-11-11 ENCOUNTER — Other Ambulatory Visit: Payer: Self-pay

## 2019-12-03 NOTE — Patient Instructions (Addendum)
DUE TO COVID-19 ONLY ONE VISITOR IS ALLOWED TO COME WITH YOU AND STAY IN THE WAITING ROOM ONLY DURING PRE OP AND PROCEDURE DAY OF SURGERY. THE 1 VISITOR  MAY VISIT WITH YOU AFTER SURGERY IN YOUR PRIVATE ROOM DURING VISITING HOURS ONLY!  YOU NEED TO HAVE A COVID 19 TEST ON: 12/11/19 @ 2:00 pm , THIS TEST MUST BE DONE BEFORE SURGERY,  COVID TESTING SITE [place] [id number], IT IS ON THE RIGHT GOING OUT [address] APPROXIMATELY  2 MINUTES PAST [employer] ON THE RIGHT. ONCE YOUR COVID TEST IS COMPLETED,  PLEASE BEGIN THE QUARANTINE INSTRUCTIONS AS OUTLINED IN YOUR HANDOUT.                Jesse Ali   Your procedure is scheduled on: 12/15/19   Report to [facility] Main  Entrance   Report to admitting at : 8:50 AM     Call this number if you have problems the morning of surgery [phone]    Remember: Do not eat solid food :After Midnight. Clear liquids from midnight until: 8:20 am.    CLEAR LIQUID DIET   Foods Allowed                                                                     Foods Excluded  Coffee and tea, regular and decaf                             liquids that you cannot  Plain Jell-O any favor except red or purple                                           see through such as: Fruit ices (not with fruit pulp)                                     milk, soups, orange juice  Iced Popsicles                                    All solid food Carbonated beverages, regular and diet                                    Cranberry, grape and apple juices Sports drinks like Gatorade Lightly seasoned clear broth or consume(fat free) Sugar, honey syrup  Sample Menu Breakfast                                Lunch                                     Supper Cranberry juice  Beef broth                            Chicken broth Jell-O                                     Grape juice                           Apple  juice Coffee or tea                        Jell-O                                      Popsicle                                                Coffee or tea                        Coffee or tea  _____________________________________________________________________  BRUSH YOUR TEETH MORNING OF SURGERY AND RINSE YOUR MOUTH OUT, NO CHEWING GUM CANDY OR MINTS.     Take these medicines the morning of surgery with A SIP OF WATER: amlodipine.                                You may not have any metal on your body including hair pins and              piercings  Do not wear jewelry, make-up, lotions, powders or perfumes, deodorant             Men may shave face and neck.   Do not bring valuables to the hospital. [place].  Contacts, dentures or bridgework may not be worn into surgery.  Leave suitcase in the car. After surgery it may be brought to your room.     Patients discharged the day of surgery will not be allowed to drive home. IF YOU ARE HAVING SURGERY AND GOING HOME THE SAME DAY, YOU MUST HAVE AN ADULT TO DRIVE YOU HOME AND BE WITH YOU FOR 24 HOURS. YOU MAY GO HOME BY TAXI OR UBER OR ORTHERWISE, BUT AN ADULT MUST ACCOMPANY YOU HOME AND STAY WITH YOU FOR 24 HOURS.  Name and phone number of your driver:  Special Instructions: N/A              Please read over the following fact sheets you were given: _____________________________________________________________________     PLEASE BRING CPAP MASK [place]. DEVICE WILL BE PROVIDED!    [facility] - Preparing for Surgery Before surgery, you can play an important role.  Because skin is not sterile, your skin needs to be as free of germs as possible.  You can reduce the number of germs on your skin by washing with CHG (chlorahexidine gluconate) soap before surgery.  CHG is an antiseptic cleaner  which kills germs and bonds with the skin to continue killing germs even after  washing. Please DO NOT use if you have an allergy to CHG or antibacterial soaps.  If your skin becomes reddened/irritated stop using the CHG and inform your nurse when you arrive at Short Stay. Do not shave (including legs and underarms) for at least 48 hours prior to the first CHG shower.  You may shave your face/neck. Please follow these instructions carefully:  1.  Shower with CHG Soap the night before surgery and the  morning of Surgery.  2.  If you choose to wash your hair, wash your hair first as usual with your  normal  shampoo.  3.  After you shampoo, rinse your hair and body thoroughly to remove the  shampoo.                           4.  Use CHG as you would any other liquid soap.  You can apply chg directly  to the skin and wash                       Gently with a scrungie or clean washcloth.  5.  Apply the CHG Soap to your body ONLY FROM THE NECK DOWN.   Do not use on face/ open                           Wound or open sores. Avoid contact with eyes, ears mouth and genitals (private parts).                       Wash face,  Genitals (private parts) with your normal soap.             6.  Wash thoroughly, paying special attention to the area where your surgery  will be performed.  7.  Thoroughly rinse your body with warm water from the neck down.  8.  DO NOT shower/wash with your normal soap after using and rinsing off  the CHG Soap.                9.  Pat yourself dry with a clean towel.            10.  Wear clean pajamas.            11.  Place clean sheets on your bed the night of your first shower and do not  sleep with pets. Day of Surgery : Do not apply any lotions/deodorants the morning of surgery.  Please wear clean clothes to the hospital/surgery center.  FAILURE TO FOLLOW THESE INSTRUCTIONS MAY RESULT IN THE CANCELLATION OF YOUR SURGERY PATIENT SIGNATURE_________________________________  NURSE  SIGNATURE__________________________________  ________________________________________________________________________

## 2019-12-03 NOTE — Progress Notes (Signed)
Pt. Needs orders for the upcomming surgery.PST appointment on 12/04/19. Thanks.

## 2019-12-04 ENCOUNTER — Other Ambulatory Visit: Payer: Self-pay

## 2019-12-04 ENCOUNTER — Encounter (HOSPITAL_COMMUNITY): Payer: Self-pay

## 2019-12-04 ENCOUNTER — Encounter (HOSPITAL_COMMUNITY)
Admission: RE | Admit: 2019-12-04 | Discharge: 2019-12-04 | Disposition: A | Discharge status: Home / Self Care | Payer: PPO | Source: Ambulatory Visit | Attending: Orthopedic Surgery | Admitting: Orthopedic Surgery

## 2019-12-04 DIAGNOSIS — Z01818 Encounter for other preprocedural examination: Secondary | ICD-10-CM | POA: Insufficient documentation

## 2019-12-04 HISTORY — DX: Malignant (primary) neoplasm, unspecified: C80.1

## 2019-12-04 LAB — SURGICAL PCR SCREEN
MRSA, PCR: NEGATIVE
Staphylococcus aureus: NEGATIVE

## 2019-12-04 LAB — BASIC METABOLIC PANEL
Anion gap: 8 (ref 5–15)
BUN: 34 mg/dL — ABNORMAL HIGH (ref 8–23)
CO2: 25 mmol/L (ref 22–32)
Calcium: 10 mg/dL (ref 8.9–10.3)
Chloride: 109 mmol/L (ref 98–111)
Creatinine, Ser: 1.65 mg/dL — ABNORMAL HIGH (ref 0.61–1.24)
GFR calc Af Amer: 48 mL/min — ABNORMAL LOW (ref >60)
GFR calc non Af Amer: 41 mL/min — ABNORMAL LOW (ref >60)
Glucose, Bld: 93 mg/dL (ref 70–99)
Potassium: 5.2 mmol/L — ABNORMAL HIGH (ref 3.5–5.1)
Sodium: 142 mmol/L (ref 135–145)

## 2019-12-04 LAB — HEMOGLOBIN A1C
Hgb A1c MFr Bld: 5.6 % (ref 4.8–5.6)
Mean Plasma Glucose: 114.02 mg/dL

## 2019-12-04 LAB — CBC
HCT: 41.7 % (ref 39.0–52.0)
Hemoglobin: 13.9 g/dL (ref 13.0–17.0)
MCH: 31.5 pg (ref 26.0–34.0)
MCHC: 33.3 g/dL (ref 30.0–36.0)
MCV: 94.6 fL (ref 80.0–100.0)
Platelets: 202 10*3/uL (ref 150–400)
RBC: 4.41 MIL/uL (ref 4.22–5.81)
RDW: 13.1 % (ref 11.5–15.5)
WBC: 9.8 10*3/uL (ref 4.0–10.5)
nRBC: 0 % (ref 0.0–0.2)

## 2019-12-04 NOTE — Progress Notes (Addendum)
COVID Vaccine Completed: yes Date COVID Vaccine completed: 09/2019 COVID vaccine manufacturer: Pfizer    *[employer]   PCP - Dr. Ronita Hipps.: Clearance on 11/07/19. : Chart. Cardiologist -   Chest x-ray -  EKG - Requested from Dr. Greggory Keen office. : 11/07/19. On chart Stress Test -  ECHO -  Cardiac Cath -   Sleep Study - yes  CPAP - yes  Fasting Blood Sugar -  Checks Blood Sugar _____ times a day  Blood Thinner Instructions: Aspirin Instructions: Last Dose:  Anesthesia review: Hx; HTN,Kidney disease stage III, OSA(Cpap)  Patient denies shortness of breath, fever, cough and chest pain at PAT appointment   Patient verbalized understanding of instructions that were given to them at the PAT appointment. Patient was also instructed that they will need to review over the PAT instructions again at home before surgery.

## 2019-12-04 NOTE — Progress Notes (Signed)
Lab result: Creatine: 1.65 Potassium: 5.2

## 2019-12-08 ENCOUNTER — Other Ambulatory Visit: Payer: Self-pay | Admitting: Orthopedic Surgery

## 2019-12-11 ENCOUNTER — Other Ambulatory Visit (HOSPITAL_COMMUNITY)
Admission: RE | Admit: 2019-12-11 | Discharge: 2019-12-11 | Disposition: A | Discharge status: Home / Self Care | Payer: PPO | Source: Ambulatory Visit | Attending: Orthopedic Surgery | Admitting: Orthopedic Surgery

## 2019-12-11 DIAGNOSIS — Z20822 Contact with and (suspected) exposure to covid-19: Secondary | ICD-10-CM | POA: Insufficient documentation

## 2019-12-11 DIAGNOSIS — Z01812 Encounter for preprocedural laboratory examination: Secondary | ICD-10-CM | POA: Diagnosis not present

## 2019-12-11 LAB — SARS CORONAVIRUS 2 (TAT 6-24 HRS): SARS Coronavirus 2: NEGATIVE

## 2019-12-12 NOTE — Progress Notes (Signed)
Pt. Was notified about the surgical time changes. Pt. Is aware that he needs to be at admitting at: 11:45 am.Also,that he is allowed to have clear liquids until: 11:15 am.

## 2019-12-14 MED ORDER — DEXTROSE 5 % IV SOLN
3.0000 g | INTRAVENOUS | Status: DC
  Filled 2019-12-14: qty 3000

## 2019-12-14 MED ORDER — BUPIVACAINE LIPOSOME 1.3 % IJ SUSP
20.0000 mL | Freq: Once | INTRAMUSCULAR | Status: DC
  Filled 2019-12-14: qty 20

## 2019-12-15 ENCOUNTER — Ambulatory Visit (HOSPITAL_COMMUNITY): Admission: RE | Admit: 2019-12-15 | Payer: PPO | Source: Home / Self Care | Admitting: Orthopedic Surgery

## 2019-12-15 ENCOUNTER — Encounter (HOSPITAL_COMMUNITY): Admission: RE | Payer: Self-pay | Source: Home / Self Care

## 2019-12-15 SURGERY — TOTAL KNEE REVISION
Anesthesia: Spinal | Site: Knee | Laterality: Left

## 2019-12-31 NOTE — Progress Notes (Addendum)
DUE TO COVID-19 ONLY ONE VISITOR IS ALLOWED TO COME WITH YOU AND STAY IN THE WAITING ROOM ONLY DURING PRE OP AND PROCEDURE DAY OF SURGERY. THE 1 VISITOR  MAY VISIT WITH YOU AFTER SURGERY IN YOUR PRIVATE ROOM DURING VISITING HOURS ONLY!  YOU NEED TO HAVE A COVID 19 TEST ON_9/16/2021 ______ @_______ , THIS TEST MUST BE DONE BEFORE SURGERY,  COVID TESTING SITE [address] [place]  [id number], IT IS ON THE RIGHT GOING OUT [address] APPROXIMATELY  2 MINUTES PAST [employer] ON THE RIGHT. ONCE YOUR COVID TEST IS COMPLETED,  PLEASE BEGIN THE QUARANTINE INSTRUCTIONS AS OUTLINED IN YOUR HANDOUT.                August Longest  12/31/2019   Your procedure is scheduled on: 01/05/20    Report to [facility] Main  Entrance   Report to admitting at   [place] AM     Call this number if you have problems the morning of surgery [phone]    Remember: NO SOLID FOOD AFTER MIDNIGHT THE NIGHT PRIOR TO SURGERY. NOTHING BY MOUTH EXCEPT CLEAR LIQUIDS UNTIL  0415am . PLEASE FINISH ENSURE DRINK PER SURGEON ORDER  WHICH NEEDS TO BE COMPLETED AT .0415am  CLEAR LIQUID DIET   Foods Allowed                                                                       Coffee and tea, regular and decaf                              Plain Jell-O any favor except red or purple                                            Fruit ices (not with fruit pulp)                                      Iced Popsicles                                     Carbonated beverages, regular and diet                                    Cranberry, grape and apple juices Sports drinks like Gatorade Lightly seasoned clear broth or consume(fat free) Sugar, honey syrup   _____________________________________________________________________    BRUSH YOUR TEETH MORNING OF SURGERY AND RINSE YOUR MOUTH OUT, NO CHEWING GUM CANDY OR MINTS.     Take these medicines the morning of surgery with A SIP OF WATER:   DO NOT TAKE ANY  DIABETIC MEDICATIONS DAY OF YOUR SURGERY  You may not have any metal on your body including hair pins and              piercings  Do not wear jewelry, make-up, lotions, powders or perfumes, deodorant             Do not wear nail polish on your fingernails.  Do not shave  48 hours prior to surgery.              Men may shave face and neck.   Do not bring valuables to the hospital. [place].  Contacts, dentures or bridgework may not be worn into surgery.  Leave suitcase in the car. After surgery it may be brought to your room.     Patients discharged the day of surgery will not be allowed to drive home. IF YOU ARE HAVING SURGERY AND GOING HOME THE SAME DAY, YOU MUST HAVE AN ADULT TO DRIVE YOU HOME AND BE WITH YOU FOR 24 HOURS. YOU MAY GO HOME BY TAXI OR UBER OR ORTHERWISE, BUT AN ADULT MUST ACCOMPANY YOU HOME AND STAY WITH YOU FOR 24 HOURS.  Name and phone number of your driver:  Special Instructions: N/A              Please read over the following fact sheets you were given: _____________________________________________________________________  [facility] - Preparing for Surgery Before surgery, you can play an important role.  Because skin is not sterile, your skin needs to be as free of germs as possible.  You can reduce the number of germs on your skin by washing with CHG (chlorahexidine gluconate) soap before surgery.  CHG is an antiseptic cleaner which kills germs and bonds with the skin to continue killing germs even after washing. Please DO NOT use if you have an allergy to CHG or antibacterial soaps.  If your skin becomes reddened/irritated stop using the CHG and inform your nurse when you arrive at Short Stay. Do not shave (including legs and underarms) for at least 48 hours prior to the first CHG shower.  You may shave your face/neck. Please follow these instructions carefully:  1.  Shower with CHG Soap  the night before surgery and the  morning of Surgery.  2.  If you choose to wash your hair, wash your hair first as usual with your  normal  shampoo.  3.  After you shampoo, rinse your hair and body thoroughly to remove the  shampoo.                           4.  Use CHG as you would any other liquid soap.  You can apply chg directly  to the skin and wash                       Gently with a scrungie or clean washcloth.  5.  Apply the CHG Soap to your body ONLY FROM THE NECK DOWN.   Do not use on face/ open                           Wound or open sores. Avoid contact with eyes, ears mouth and genitals (private parts).  Wash face,  Genitals (private parts) with your normal soap.             6.  Wash thoroughly, paying special attention to the area where your surgery  will be performed.  7.  Thoroughly rinse your body with warm water from the neck down.  8.  DO NOT shower/wash with your normal soap after using and rinsing off  the CHG Soap.                9.  Pat yourself dry with a clean towel.            10.  Wear clean pajamas.            11.  Place clean sheets on your bed the night of your first shower and do not  sleep with pets. Day of Surgery : Do not apply any lotions/deodorants the morning of surgery.  Please wear clean clothes to the hospital/surgery center.  FAILURE TO FOLLOW THESE INSTRUCTIONS MAY RESULT IN THE CANCELLATION OF YOUR SURGERY PATIENT SIGNATURE_________________________________  NURSE SIGNATURE__________________________________  ________________________________________________________________________

## 2020-01-01 ENCOUNTER — Other Ambulatory Visit: Payer: Self-pay

## 2020-01-01 ENCOUNTER — Other Ambulatory Visit: Payer: Self-pay | Admitting: Orthopedic Surgery

## 2020-01-01 ENCOUNTER — Other Ambulatory Visit (HOSPITAL_COMMUNITY)
Admission: RE | Admit: 2020-01-01 | Discharge: 2020-01-01 | Disposition: A | Discharge status: Home / Self Care | Payer: PPO | Source: Ambulatory Visit | Attending: Orthopedic Surgery | Admitting: Orthopedic Surgery

## 2020-01-01 ENCOUNTER — Encounter (HOSPITAL_COMMUNITY): Payer: Self-pay

## 2020-01-01 ENCOUNTER — Encounter (HOSPITAL_COMMUNITY)
Admission: RE | Admit: 2020-01-01 | Discharge: 2020-01-01 | Disposition: A | Discharge status: Home / Self Care | Payer: PPO | Source: Ambulatory Visit | Attending: Orthopedic Surgery | Admitting: Orthopedic Surgery

## 2020-01-01 DIAGNOSIS — Z20822 Contact with and (suspected) exposure to covid-19: Secondary | ICD-10-CM | POA: Diagnosis not present

## 2020-01-01 DIAGNOSIS — Z01812 Encounter for preprocedural laboratory examination: Secondary | ICD-10-CM | POA: Insufficient documentation

## 2020-01-01 LAB — COMPREHENSIVE METABOLIC PANEL
ALT: 21 U/L (ref 0–44)
AST: 16 U/L (ref 15–41)
Albumin: 4.2 g/dL (ref 3.5–5.0)
Alkaline Phosphatase: 83 U/L (ref 38–126)
Anion gap: 10 (ref 5–15)
BUN: 31 mg/dL — ABNORMAL HIGH (ref 8–23)
CO2: 26 mmol/L (ref 22–32)
Calcium: 10.3 mg/dL (ref 8.9–10.3)
Chloride: 106 mmol/L (ref 98–111)
Creatinine, Ser: 1.77 mg/dL — ABNORMAL HIGH (ref 0.61–1.24)
GFR calc Af Amer: 44 mL/min — ABNORMAL LOW (ref >60)
GFR calc non Af Amer: 38 mL/min — ABNORMAL LOW (ref >60)
Glucose, Bld: 100 mg/dL — ABNORMAL HIGH (ref 70–99)
Potassium: 5 mmol/L (ref 3.5–5.1)
Sodium: 142 mmol/L (ref 135–145)
Total Bilirubin: 0.7 mg/dL (ref 0.3–1.2)
Total Protein: 7.7 g/dL (ref 6.5–8.1)

## 2020-01-01 LAB — CBC WITH DIFFERENTIAL/PLATELET
Abs Immature Granulocytes: 0.02 10*3/uL (ref 0.00–0.07)
Basophils Absolute: 0.1 10*3/uL (ref 0.0–0.1)
Basophils Relative: 1 %
Eosinophils Absolute: 0.2 10*3/uL (ref 0.0–0.5)
Eosinophils Relative: 3 %
HCT: 40.6 % (ref 39.0–52.0)
Hemoglobin: 13.4 g/dL (ref 13.0–17.0)
Immature Granulocytes: 0 %
Lymphocytes Relative: 23 %
Lymphs Abs: 1.9 10*3/uL (ref 0.7–4.0)
MCH: 31.3 pg (ref 26.0–34.0)
MCHC: 33 g/dL (ref 30.0–36.0)
MCV: 94.9 fL (ref 80.0–100.0)
Monocytes Absolute: 1 10*3/uL (ref 0.1–1.0)
Monocytes Relative: 13 %
Neutro Abs: 4.9 10*3/uL (ref 1.7–7.7)
Neutrophils Relative %: 60 %
Platelets: 200 10*3/uL (ref 150–400)
RBC: 4.28 MIL/uL (ref 4.22–5.81)
RDW: 13.1 % (ref 11.5–15.5)
WBC: 8.1 10*3/uL (ref 4.0–10.5)
nRBC: 0 % (ref 0.0–0.2)

## 2020-01-01 LAB — SURGICAL PCR SCREEN
MRSA, PCR: NEGATIVE
Staphylococcus aureus: NEGATIVE

## 2020-01-01 LAB — SARS CORONAVIRUS 2 (TAT 6-24 HRS): SARS Coronavirus 2: NEGATIVE

## 2020-01-01 NOTE — Progress Notes (Addendum)
Anesthesia Review:  PCP: DR Kennith Maes- [place] - clearance on chart  Cardiologist : Chest x-ray : EKG : 11/07/19 on chart  Echo : Stress test: Cardiac Cath :  Activity level: can do a flight of stairs except for knee  Sleep Study/ CPAP : yes  Fasting Blood Sugar :      / Checks Blood Sugar -- times a day:   Blood Thinner/ Instructions /Last Dose: ASA / Instructions/ Last Dose :  81 mg ASA- Last dose on 12/29/19 per pt  Pt was originally scheduled for surgery on 8.30/21 byt per pt wash rescheduled because he was staying overnite.  Now he has been changed to outpt .  CMP done 9/16/faxed via eipc to Dr Ronnie Derby.

## 2020-01-01 NOTE — Progress Notes (Signed)
CMP done 01/01/20 routed via epic to Dr Ronnie Derby.

## 2020-01-03 ENCOUNTER — Encounter (HOSPITAL_COMMUNITY): Payer: Self-pay | Admitting: Orthopedic Surgery

## 2020-01-04 MED ORDER — DEXTROSE 5 % IV SOLN
3.0000 g | INTRAVENOUS | Status: AC
  Administered 2020-01-05: 3 g via INTRAVENOUS
  Filled 2020-01-04: qty 3

## 2020-01-04 MED ORDER — BUPIVACAINE LIPOSOME 1.3 % IJ SUSP
20.0000 mL | Freq: Once | INTRAMUSCULAR | Status: DC
  Filled 2020-01-04: qty 20

## 2020-01-04 NOTE — Anesthesia Preprocedure Evaluation (Addendum)
Anesthesia Evaluation  Patient identified by MRN, date of birth, ID band Patient awake    Reviewed: Allergy & Precautions, NPO status , Patient's Chart, lab work & pertinent test results  History of Anesthesia Complications Negative for: history of anesthetic complications  Airway Mallampati: III  TM Distance: >3 FB Neck ROM: Full    Dental  (+) Dental Advisory Given   Pulmonary sleep apnea and Continuous Positive Airway Pressure Ventilation , former smoker,    Pulmonary exam normal        Cardiovascular hypertension, Pt. on medications Normal cardiovascular exam     Neuro/Psych negative neurological ROS  negative psych ROS   GI/Hepatic negative GI ROS, Neg liver ROS,   Endo/Other  Morbid obesity  Renal/GU CRFRenal disease     Musculoskeletal  (+) Arthritis ,   Abdominal   Peds  Hematology negative hematology ROS (+)  Plt 200k    Anesthesia Other Findings Covid test negative   Reproductive/Obstetrics                            Anesthesia Physical Anesthesia Plan  ASA: III  Anesthesia Plan: Spinal   Post-op Pain Management:  [place]ional for Post-op pain   Induction:   PONV Risk Score and Plan: 1 and Treatment may vary due to age or medical condition and Propofol infusion  Airway Management Planned: Natural Airway and Simple Face Mask  Additional Equipment: None  Intra-op Plan:   Post-operative Plan:   Informed Consent: I have reviewed the patients History and Physical, chart, labs and discussed the procedure including the risks, benefits and alternatives for the proposed anesthesia with the patient or authorized representative who has indicated his/her understanding and acceptance.       Plan Discussed with: CRNA and Anesthesiologist  Anesthesia Plan Comments: (Labs reviewed, platelets acceptable. Discussed risks and benefits of spinal, including spinal/epidural  hematoma, infection, failed block, and PDPH. Patient expressed understanding and wished to proceed. )       Anesthesia Quick Evaluation

## 2020-01-05 ENCOUNTER — Encounter (HOSPITAL_COMMUNITY): Payer: Self-pay | Admitting: Orthopedic Surgery

## 2020-01-05 ENCOUNTER — Other Ambulatory Visit: Payer: Self-pay

## 2020-01-05 ENCOUNTER — Ambulatory Visit (HOSPITAL_COMMUNITY): Payer: PPO | Admitting: Anesthesiology

## 2020-01-05 ENCOUNTER — Encounter (HOSPITAL_COMMUNITY)
Admission: RE | Disposition: A | Discharge status: Home / Self Care | Payer: Self-pay | Source: Home / Self Care | Attending: Orthopedic Surgery

## 2020-01-05 ENCOUNTER — Ambulatory Visit (HOSPITAL_COMMUNITY): Payer: PPO | Admitting: Physician Assistant

## 2020-01-05 ENCOUNTER — Ambulatory Visit (HOSPITAL_COMMUNITY)
Admission: RE | Admit: 2020-01-05 | Discharge: 2020-01-05 | Disposition: A | Discharge status: Home / Self Care | Payer: PPO | Attending: Orthopedic Surgery | Admitting: Orthopedic Surgery

## 2020-01-05 DIAGNOSIS — Z7982 Long term (current) use of aspirin: Secondary | ICD-10-CM | POA: Insufficient documentation

## 2020-01-05 DIAGNOSIS — Z6841 Body Mass Index (BMI) 40.0 and over, adult: Secondary | ICD-10-CM | POA: Diagnosis not present

## 2020-01-05 DIAGNOSIS — G8918 Other acute postprocedural pain: Secondary | ICD-10-CM | POA: Diagnosis not present

## 2020-01-05 DIAGNOSIS — G4730 Sleep apnea, unspecified: Secondary | ICD-10-CM | POA: Insufficient documentation

## 2020-01-05 DIAGNOSIS — I10 Essential (primary) hypertension: Secondary | ICD-10-CM | POA: Insufficient documentation

## 2020-01-05 DIAGNOSIS — Z87891 Personal history of nicotine dependence: Secondary | ICD-10-CM | POA: Insufficient documentation

## 2020-01-05 DIAGNOSIS — Z96652 Presence of left artificial knee joint: Secondary | ICD-10-CM | POA: Diagnosis not present

## 2020-01-05 DIAGNOSIS — T84033A Mechanical loosening of internal left knee prosthetic joint, initial encounter: Secondary | ICD-10-CM | POA: Diagnosis not present

## 2020-01-05 DIAGNOSIS — M1712 Unilateral primary osteoarthritis, left knee: Secondary | ICD-10-CM | POA: Diagnosis not present

## 2020-01-05 DIAGNOSIS — Z85828 Personal history of other malignant neoplasm of skin: Secondary | ICD-10-CM | POA: Diagnosis not present

## 2020-01-05 DIAGNOSIS — Y831 Surgical operation with implant of artificial internal device as the cause of abnormal reaction of the patient, or of later complication, without mention of misadventure at the time of the procedure: Secondary | ICD-10-CM | POA: Diagnosis not present

## 2020-01-05 DIAGNOSIS — Z791 Long term (current) use of non-steroidal anti-inflammatories (NSAID): Secondary | ICD-10-CM | POA: Diagnosis not present

## 2020-01-05 DIAGNOSIS — Z79899 Other long term (current) drug therapy: Secondary | ICD-10-CM | POA: Insufficient documentation

## 2020-01-05 DIAGNOSIS — Z96653 Presence of artificial knee joint, bilateral: Secondary | ICD-10-CM | POA: Diagnosis not present

## 2020-01-05 DIAGNOSIS — M6752 Plica syndrome, left knee: Secondary | ICD-10-CM | POA: Diagnosis not present

## 2020-01-05 DIAGNOSIS — E785 Hyperlipidemia, unspecified: Secondary | ICD-10-CM | POA: Diagnosis not present

## 2020-01-05 DIAGNOSIS — T84093A Other mechanical complication of internal left knee prosthesis, initial encounter: Secondary | ICD-10-CM | POA: Diagnosis not present

## 2020-01-05 HISTORY — PX: TOTAL KNEE REVISION: SHX996

## 2020-01-05 LAB — TYPE AND SCREEN
ABO/RH(D): A NEG
Antibody Screen: POSITIVE
Unit division: 0
Unit division: 0

## 2020-01-05 LAB — BPAM RBC
Blood Product Expiration Date: 202110032359
Blood Product Expiration Date: 202110142359
Unit Type and Rh: 600
Unit Type and Rh: 600

## 2020-01-05 SURGERY — TOTAL KNEE REVISION
Anesthesia: Spinal | Site: Knee | Laterality: Left

## 2020-01-05 MED ORDER — GABAPENTIN 300 MG PO CAPS
ORAL_CAPSULE | ORAL | Status: AC
  Filled 2020-01-05: qty 1

## 2020-01-05 MED ORDER — SODIUM CHLORIDE 0.9 % IR SOLN
Status: DC | PRN
  Administered 2020-01-05: 3000 mL

## 2020-01-05 MED ORDER — CELECOXIB 200 MG PO CAPS
400.0000 mg | ORAL_CAPSULE | Freq: Once | ORAL | Status: AC
  Administered 2020-01-05: 400 mg via ORAL
  Filled 2020-01-05: qty 2

## 2020-01-05 MED ORDER — MIDAZOLAM HCL 2 MG/2ML IJ SOLN
INTRAMUSCULAR | Status: AC
  Filled 2020-01-05: qty 2

## 2020-01-05 MED ORDER — 0.9 % SODIUM CHLORIDE (POUR BTL) OPTIME
TOPICAL | Status: DC | PRN
  Administered 2020-01-05: 1000 mL

## 2020-01-05 MED ORDER — ACETAMINOPHEN 500 MG PO TABS
1000.0000 mg | ORAL_TABLET | Freq: Once | ORAL | Status: AC
  Administered 2020-01-05: 1000 mg via ORAL
  Filled 2020-01-05: qty 2

## 2020-01-05 MED ORDER — LIDOCAINE 2% (20 MG/ML) 5 ML SYRINGE
INTRAMUSCULAR | Status: AC
  Filled 2020-01-05: qty 5

## 2020-01-05 MED ORDER — METHOCARBAMOL 500 MG PO TABS: 500.0000 mg | ORAL_TABLET | Freq: Four times a day (QID) | ORAL | 0 refills | Status: AC

## 2020-01-05 MED ORDER — TRANEXAMIC ACID-NACL 1000-0.7 MG/100ML-% IV SOLN: 1000.0000 mg | Freq: Once | INTRAVENOUS | Status: DC

## 2020-01-05 MED ORDER — PROPOFOL 10 MG/ML IV BOLUS
INTRAVENOUS | Status: AC
  Filled 2020-01-05: qty 40

## 2020-01-05 MED ORDER — ONDANSETRON HCL 4 MG/2ML IJ SOLN
INTRAMUSCULAR | Status: AC
  Filled 2020-01-05: qty 2

## 2020-01-05 MED ORDER — POVIDONE-IODINE 10 % EX SWAB
2.0000 "application " | Freq: Once | CUTANEOUS | Status: AC
  Administered 2020-01-05: 2 via TOPICAL

## 2020-01-05 MED ORDER — FENTANYL CITRATE (PF) 100 MCG/2ML IJ SOLN
INTRAMUSCULAR | Status: AC
  Filled 2020-01-05: qty 2

## 2020-01-05 MED ORDER — FENTANYL CITRATE (PF) 100 MCG/2ML IJ SOLN: 25.0000 ug | INTRAMUSCULAR | Status: DC | PRN

## 2020-01-05 MED ORDER — SODIUM CHLORIDE 0.9% FLUSH
INTRAVENOUS | Status: DC | PRN
  Administered 2020-01-05: 20 mL

## 2020-01-05 MED ORDER — ROPIVACAINE HCL 7.5 MG/ML IJ SOLN
INTRAMUSCULAR | Status: DC | PRN
  Administered 2020-01-05: 20 mL via PERINEURAL

## 2020-01-05 MED ORDER — LACTATED RINGERS IV SOLN: INTRAVENOUS | Status: DC

## 2020-01-05 MED ORDER — BUPIVACAINE-EPINEPHRINE 0.5% -1:200000 IJ SOLN
INTRAMUSCULAR | Status: DC | PRN
  Administered 2020-01-05: 30 mL

## 2020-01-05 MED ORDER — STERILE WATER FOR IRRIGATION IR SOLN
Status: DC | PRN
  Administered 2020-01-05: 2000 mL

## 2020-01-05 MED ORDER — METHOCARBAMOL 500 MG IVPB - SIMPLE MED
INTRAVENOUS | Status: AC
  Filled 2020-01-05: qty 50

## 2020-01-05 MED ORDER — CEFAZOLIN SODIUM-DEXTROSE 2-4 GM/100ML-% IV SOLN: 2.0000 g | Freq: Four times a day (QID) | INTRAVENOUS | Status: DC

## 2020-01-05 MED ORDER — LACTATED RINGERS IV BOLUS
250.0000 mL | Freq: Once | INTRAVENOUS | Status: AC
  Administered 2020-01-05: 250 mL via INTRAVENOUS

## 2020-01-05 MED ORDER — DEXAMETHASONE SODIUM PHOSPHATE 10 MG/ML IJ SOLN
INTRAMUSCULAR | Status: AC
  Filled 2020-01-05: qty 1

## 2020-01-05 MED ORDER — PHENYLEPHRINE HCL-NACL 10-0.9 MG/250ML-% IV SOLN
INTRAVENOUS | Status: DC | PRN
  Administered 2020-01-05: 25 ug/min via INTRAVENOUS

## 2020-01-05 MED ORDER — OXYCODONE HCL 5 MG PO TABS: 5.0000 mg | ORAL_TABLET | Freq: Four times a day (QID) | ORAL | 0 refills | Status: AC | PRN

## 2020-01-05 MED ORDER — TRANEXAMIC ACID-NACL 1000-0.7 MG/100ML-% IV SOLN
1000.0000 mg | INTRAVENOUS | Status: AC
  Administered 2020-01-05: 1000 mg via INTRAVENOUS
  Filled 2020-01-05: qty 100

## 2020-01-05 MED ORDER — ASPIRIN EC 325 MG PO TBEC: 325.0000 mg | DELAYED_RELEASE_TABLET | Freq: Two times a day (BID) | ORAL | 0 refills | Status: AC

## 2020-01-05 MED ORDER — CHLORHEXIDINE GLUCONATE 0.12 % MT SOLN
15.0000 mL | Freq: Once | OROMUCOSAL | Status: AC
  Administered 2020-01-05: 15 mL via OROMUCOSAL

## 2020-01-05 MED ORDER — BUPIVACAINE-EPINEPHRINE 0.5% -1:200000 IJ SOLN
INTRAMUSCULAR | Status: AC
  Filled 2020-01-05: qty 1

## 2020-01-05 MED ORDER — GLYCOPYRROLATE PF 0.2 MG/ML IJ SOSY
PREFILLED_SYRINGE | INTRAMUSCULAR | Status: AC
  Filled 2020-01-05: qty 1

## 2020-01-05 MED ORDER — ONDANSETRON HCL 4 MG/2ML IJ SOLN
INTRAMUSCULAR | Status: DC | PRN
  Administered 2020-01-05: 4 mg via INTRAVENOUS

## 2020-01-05 MED ORDER — BUPIVACAINE IN DEXTROSE 0.75-8.25 % IT SOLN
INTRATHECAL | Status: DC | PRN
  Administered 2020-01-05: 1.8 mL via INTRATHECAL

## 2020-01-05 MED ORDER — LIDOCAINE 2% (20 MG/ML) 5 ML SYRINGE
INTRAMUSCULAR | Status: DC | PRN
  Administered 2020-01-05: 30 mg via INTRAVENOUS

## 2020-01-05 MED ORDER — PROPOFOL 1000 MG/100ML IV EMUL
INTRAVENOUS | Status: AC
  Filled 2020-01-05: qty 100

## 2020-01-05 MED ORDER — LACTATED RINGERS IV BOLUS
500.0000 mL | Freq: Once | INTRAVENOUS | Status: AC
  Administered 2020-01-05: 500 mL via INTRAVENOUS

## 2020-01-05 MED ORDER — DEXAMETHASONE SODIUM PHOSPHATE 10 MG/ML IJ SOLN
INTRAMUSCULAR | Status: DC | PRN
  Administered 2020-01-05: 8 mg via INTRAVENOUS

## 2020-01-05 MED ORDER — MIDAZOLAM HCL 5 MG/5ML IJ SOLN
INTRAMUSCULAR | Status: DC | PRN
  Administered 2020-01-05 (×2): 1 mg via INTRAVENOUS

## 2020-01-05 MED ORDER — FENTANYL CITRATE (PF) 100 MCG/2ML IJ SOLN
INTRAMUSCULAR | Status: DC | PRN
Start: 2020-01-05 — End: 2020-01-05
  Administered 2020-01-05: 50 ug via INTRAVENOUS

## 2020-01-05 MED ORDER — GABAPENTIN 300 MG PO CAPS
300.0000 mg | ORAL_CAPSULE | Freq: Once | ORAL | Status: AC
  Administered 2020-01-05: 300 mg via ORAL
  Filled 2020-01-05: qty 1

## 2020-01-05 MED ORDER — DEXAMETHASONE SODIUM PHOSPHATE 10 MG/ML IJ SOLN: 8.0000 mg | Freq: Once | INTRAMUSCULAR | Status: DC

## 2020-01-05 MED ORDER — ROCURONIUM BROMIDE 10 MG/ML (PF) SYRINGE
PREFILLED_SYRINGE | INTRAVENOUS | Status: AC
  Filled 2020-01-05: qty 10

## 2020-01-05 MED ORDER — PROPOFOL 500 MG/50ML IV EMUL
INTRAVENOUS | Status: DC | PRN
  Administered 2020-01-05: 55 ug/kg/min via INTRAVENOUS

## 2020-01-05 MED ORDER — BUPIVACAINE LIPOSOME 1.3 % IJ SUSP
INTRAMUSCULAR | Status: DC | PRN
  Administered 2020-01-05: 20 mL

## 2020-01-05 MED ORDER — OXYCODONE HCL 5 MG/5ML PO SOLN: 5.0000 mg | Freq: Once | ORAL | Status: DC | PRN

## 2020-01-05 MED ORDER — TRANEXAMIC ACID 1000 MG/10ML IV SOLN
2000.0000 mg | Freq: Once | INTRAVENOUS | Status: AC
  Administered 2020-01-05: 2000 mg via TOPICAL
  Filled 2020-01-05: qty 20

## 2020-01-05 MED ORDER — BUPIVACAINE-EPINEPHRINE (PF) 0.25% -1:200000 IJ SOLN
INTRAMUSCULAR | Status: AC
  Filled 2020-01-05: qty 30

## 2020-01-05 MED ORDER — GLYCOPYRROLATE 0.2 MG/ML IJ SOLN
INTRAMUSCULAR | Status: DC | PRN
  Administered 2020-01-05 (×2): .1 mg via INTRAVENOUS

## 2020-01-05 MED ORDER — METHOCARBAMOL 500 MG IVPB - SIMPLE MED
500.0000 mg | Freq: Once | INTRAVENOUS | Status: AC
  Administered 2020-01-05: 500 mg via INTRAVENOUS

## 2020-01-05 MED ORDER — OXYCODONE HCL 5 MG PO TABS: 5.0000 mg | ORAL_TABLET | Freq: Once | ORAL | Status: DC | PRN

## 2020-01-05 MED ORDER — BUPIVACAINE-EPINEPHRINE (PF) 0.5% -1:200000 IJ SOLN
INTRAMUSCULAR | Status: AC
  Filled 2020-01-05: qty 30

## 2020-01-05 MED ORDER — SODIUM CHLORIDE (PF) 0.9 % IJ SOLN
INTRAMUSCULAR | Status: AC
  Filled 2020-01-05: qty 50

## 2020-01-05 MED ORDER — ONDANSETRON HCL 4 MG/2ML IJ SOLN: 4.0000 mg | Freq: Once | INTRAMUSCULAR | Status: DC | PRN

## 2020-01-05 MED ORDER — ORAL CARE MOUTH RINSE: 15.0000 mL | Freq: Once | OROMUCOSAL | Status: AC

## 2020-01-05 MED ORDER — PROPOFOL 500 MG/50ML IV EMUL
INTRAVENOUS | Status: AC
  Filled 2020-01-05: qty 50

## 2020-01-05 SURGICAL SUPPLY — 69 items
AUG FEM 7 7+ 10 STRL KN DIST (Joint) ×1 IMPLANT
AUG FEM 7 7+ 5 STRL LF KN POST (Joint) ×1 IMPLANT
BAG SPEC THK2 15X12 ZIP CLS (MISCELLANEOUS) ×1
BAG ZIPLOCK 12X15 (MISCELLANEOUS) ×3 IMPLANT
BLADE FLEX CHISEL 8 2.6 (MISCELLANEOUS) ×3 IMPLANT
BLADE SAGITTAL 13X1.27X60 (BLADE) ×2 IMPLANT
BLADE SAGITTAL 13X1.27X60MM (BLADE) ×1
BLADE SAW SGTL 81X20 HD (BLADE) ×3 IMPLANT
BLADE SAW SGTL 83.5X18.5 (BLADE) ×3 IMPLANT
BLADE SURG 15 STRL LF DISP TIS (BLADE) ×1 IMPLANT
BLADE SURG 15 STRL SS (BLADE) ×3
BLADE SURG SZ10 CARB STEEL (BLADE) ×12 IMPLANT
BNDG CMPR MED 15X6 ELC VLCR LF (GAUZE/BANDAGES/DRESSINGS) ×1
BNDG ELASTIC 6X15 VLCR STRL LF (GAUZE/BANDAGES/DRESSINGS) ×3 IMPLANT
BOWL SMART MIX CTS (DISPOSABLE) ×3 IMPLANT
CEMENT BONE SIMPLEX SPEEDSET (Cement) ×9 IMPLANT
CLOSURE WOUND 1/2 X4 (GAUZE/BANDAGES/DRESSINGS) ×1
COMP FEM STD SZ7 (Joint) ×3 IMPLANT
COMPONENT FEM STD SZ7 (Joint) ×1 IMPLANT
COMPONENT TIB KNEE SZ E UNC (Knees) ×3 IMPLANT
COVER SURGICAL LIGHT HANDLE (MISCELLANEOUS) ×3 IMPLANT
COVER WAND RF STERILE (DRAPES) IMPLANT
CUFF TOURN SGL QUICK 34 (TOURNIQUET CUFF)
CUFF TRNQT CYL 34X4.125X (TOURNIQUET CUFF) IMPLANT
DECANTER SPIKE VIAL GLASS SM (MISCELLANEOUS) IMPLANT
DRAPE INCISE IOBAN 66X45 STRL (DRAPES) ×6 IMPLANT
DRAPE U-SHAPE 47X51 STRL (DRAPES) ×3 IMPLANT
DRSG AQUACEL AG ADV 3.5X10 (GAUZE/BANDAGES/DRESSINGS) ×3 IMPLANT
DRSG AQUACEL AG ADV 3.5X14 (GAUZE/BANDAGES/DRESSINGS) ×3 IMPLANT
DURAPREP 26ML APPLICATOR (WOUND CARE) ×3 IMPLANT
ELECT REM PT RETURN 15FT ADLT (MISCELLANEOUS) ×3 IMPLANT
GLOVE BIOGEL M STRL SZ7.5 (GLOVE) ×3 IMPLANT
GLOVE BIOGEL PI IND STRL 7.5 (GLOVE) ×1 IMPLANT
GLOVE BIOGEL PI IND STRL 8.5 (GLOVE) ×2 IMPLANT
GLOVE BIOGEL PI INDICATOR 7.5 (GLOVE) ×2
GLOVE BIOGEL PI INDICATOR 8.5 (GLOVE) ×4
GLOVE SURG ORTHO 8.0 STRL STRW (GLOVE) ×9 IMPLANT
GOWN STRL REUS W/ TWL XL LVL3 (GOWN DISPOSABLE) ×1 IMPLANT
GOWN STRL REUS W/TWL XL LVL3 (GOWN DISPOSABLE) ×3
HANDPIECE INTERPULSE COAX TIP (DISPOSABLE) ×3
HOLDER FOLEY CATH W/STRAP (MISCELLANEOUS) IMPLANT
HOOD PEEL AWAY FLYTE STAYCOOL (MISCELLANEOUS) ×9 IMPLANT
KIT TURNOVER KIT A (KITS) IMPLANT
MANIFOLD NEPTUNE II (INSTRUMENTS) ×3 IMPLANT
NEEDLE HYPO 22GX1.5 SAFETY (NEEDLE) ×3 IMPLANT
NS IRRIG 1000ML POUR BTL (IV SOLUTION) ×3 IMPLANT
PACK TOTAL KNEE CUSTOM (KITS) ×3 IMPLANT
PENCIL SMOKE EVACUATOR (MISCELLANEOUS) IMPLANT
PROTECTOR NERVE ULNAR (MISCELLANEOUS) ×3 IMPLANT
SET HNDPC FAN SPRY TIP SCT (DISPOSABLE) ×1 IMPLANT
STAPLER VISISTAT 35W (STAPLE) IMPLANT
STEM EXT FEM STRAIGHT 13X135 (Miscellaneous) ×3 IMPLANT
STEM FEM OFFSET 135X16X3 (Stem) ×3 IMPLANT
STRIP CLOSURE SKIN 1/2X4 (GAUZE/BANDAGES/DRESSINGS) ×2 IMPLANT
SURF ART KNEE SZ 7-9+ EF 16 (Joint) ×3 IMPLANT
SURFACE ART KNEE SZ 7-9+ EF 16 (Joint) ×1 IMPLANT
SUT BONE WAX W31G (SUTURE) ×3 IMPLANT
SUT MNCRL AB 3-0 PS2 18 (SUTURE) ×3 IMPLANT
SUT STRATAFIX 0 PDS 27 VIOLET (SUTURE) ×3
SUT STRATAFIX PDS+ 0 24IN (SUTURE) ×3 IMPLANT
SUT VIC AB 1 CT1 36 (SUTURE) ×3 IMPLANT
SUTURE STRATFX 0 PDS 27 VIOLET (SUTURE) ×1 IMPLANT
SYR CONTROL 10ML LL (SYRINGE) ×6 IMPLANT
TRAY FOLEY MTR SLVR 16FR STAT (SET/KITS/TRAYS/PACK) ×3 IMPLANT
WATER STERILE IRR 1000ML POUR (IV SOLUTION) ×3 IMPLANT
WEDGE FEM ARTHROPLASTY SZ7 10 (Joint) ×3 IMPLANT
WEDGE FEM ARTHROPLASTY SZ7+ 10 (Joint) ×3 IMPLANT
WRAP KNEE MAXI GEL POST OP (GAUZE/BANDAGES/DRESSINGS) ×3 IMPLANT
YANKAUER SUCT BULB TIP 10FT TU (MISCELLANEOUS) ×3 IMPLANT

## 2020-01-05 NOTE — Anesthesia Postprocedure Evaluation (Signed)
Anesthesia Post Note  Patient: Jesse Ali  Procedure(s) Performed: TOTAL KNEE REVISION (Left Knee)     Patient location during evaluation: PACU Anesthesia Type: Spinal Level of consciousness: awake and alert Pain management: pain level controlled Vital Signs Assessment: post-procedure vital signs reviewed and stable Respiratory status: spontaneous breathing and respiratory function stable Cardiovascular status: blood pressure returned to baseline and stable Postop Assessment: spinal receding and no apparent nausea or vomiting Anesthetic complications: no   No complications documented.  Last Vitals:  Vitals:   01/05/20 1128 01/05/20 1134  BP: 119/76 123/83  Pulse: 65   Resp: 18   Temp: (!) 36.3 C   SpO2: 98%     Last Pain:  Vitals:   01/05/20 1128  TempSrc:   PainSc: [name]

## 2020-01-05 NOTE — Progress Notes (Signed)
Orthopedic Tech Progress Note Patient Details:  Jesse Ali Jan 25, 1951 [id number]  CPM Left Knee CPM Left Knee: On Left Knee Flexion (Degrees): 90 Left Knee Extension (Degrees): 0  Post Interventions Patient Tolerated: Well Instructions Provided: Care of device  Braulio Bosch 01/05/2020, 10:46 AM

## 2020-01-05 NOTE — Anesthesia Procedure Notes (Signed)
Spinal  Patient location during procedure: OR Start time: 01/05/2020 7:19 AM End time: 01/05/2020 7:23 AM Staffing Performed: anesthesiologist  Anesthesiologist: Audry Pili, MD Preanesthetic Checklist Completed: patient identified, IV checked, risks and benefits discussed, surgical consent, monitors and equipment checked, pre-op evaluation and timeout performed Spinal Block Patient position: sitting Prep: DuraPrep Patient monitoring: heart rate, cardiac monitor, continuous pulse ox and blood pressure Approach: midline Location: L2-3 Injection technique: single-shot Needle Needle type: Pencan  Needle gauge: 24 G Additional Notes Consent was obtained prior to the procedure with all questions answered and concerns addressed. Risks including, but not limited to, bleeding, infection, nerve damage, paralysis, failed block, inadequate analgesia, allergic reaction, high spinal, itching, and headache were discussed and the patient wished to proceed. Functioning IV was confirmed and monitors were applied. Sterile prep and drape, including hand hygiene, mask, and sterile gloves were used. The patient was positioned and the spine was prepped. The skin was anesthetized with lidocaine. Free flow of clear CSF was obtained prior to injecting local anesthetic into the CSF. The spinal needle aspirated freely following injection. The needle was carefully withdrawn. The patient tolerated the procedure well.   Renold Don, MD

## 2020-01-05 NOTE — Progress Notes (Signed)
Orthopedic Tech Progress Note Patient Details:  Jesse Ali 02-Jan-1951 [id number] Off cpm at 12:30 Patient ID: [name], male   DOB: November 07, 1950, 69 y.o.   MRN: [id number]   Braulio Bosch 01/05/2020, 2:25 PM

## 2020-01-05 NOTE — H&P (Signed)
Jesse Ali MRN:  [id number] DOB/SEX:  [birth year]/male  CHIEF COMPLAINT:  Painful left Knee  HISTORY: Patient is a 69 y.o. male presented with a history of pain in the left knee. Onset of symptoms was gradual starting a few years ago with gradually worsening course since that time. Patient has been treated conservatively with over-the-counter NSAIDs and activity modification. Patient currently rates pain in the knee at 10 out of 10 with activity. There is pain at night.  PAST MEDICAL HISTORY: Patient Active Problem List   Diagnosis Date Noted  . S/P total knee replacement 10/29/2017  . 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 Arthritis of left knee 03/29/2016  . Bilateral primary osteoarthritis of knee 02/08/2016   Past Medical History:  Diagnosis Date  . Arthritis    fingers  . Cancer ([place])    skin Ca,on right eye  . CKD (chronic kidney disease), stage III    patient denies  . Hyperlipidemia   . Hypertension   . Sleep apnea    wears CPAP  does not know settins   . Wears glasses   . Wears glasses    Past Surgical History:  Procedure Laterality Date  . APPENDECTOMY    . COLONOSCOPY    . JOINT REPLACEMENT    . KNEE ARTHROSCOPY Right 1998   2x  . KNEE ARTHROSCOPY Left 1985  . SHOULDER SURGERY Right 2007   removal bone spurs  . TOTAL KNEE ARTHROPLASTY Left 03/29/2016   Procedure: LEFT TOTAL KNEE ARTHROPLASTY;  Surgeon: Marybelle Killings, MD;  Location: [facility];  Service: Orthopedics;  Laterality: Left;  . TOTAL KNEE ARTHROPLASTY Right 10/29/2017   Procedure: RIGHT TOTAL KNEE ARTHROPLASTY;  Surgeon: Vickey Huger, MD;  Location: [facility];  Service: Orthopedics;  Laterality: Right;     MEDICATIONS:   Medications Prior to Admission  Medication Sig Dispense Refill Last Dose  . amLODipine (NORVASC) 5 MG tablet Take 5 mg by mouth every evening.    01/04/2020 at Unknown time  . aspirin EC 81 MG tablet Take 81 mg by mouth daily. Swallow  whole.   Past Week at Unknown time  . atorvastatin (LIPITOR) 10 MG tablet Take 10 mg by mouth every evening.    01/04/2020 at Unknown time  . azelastine (ASTELIN) 0.1 % nasal spray Place 1 spray into both nostrils daily as needed for allergies. Use in each nostril as directed    Past Week at Unknown time  . gabapentin (NEURONTIN) 300 MG capsule Take 300 mg by mouth every evening.    01/04/2020 at Unknown time  . losartan (COZAAR) 100 MG tablet Take 100 mg by mouth every evening.   01/04/2020 at Unknown time  . naproxen sodium (ALEVE) 220 MG tablet Take 220 mg by mouth daily.   Past Week at Unknown time  . aspirin EC 325 MG EC tablet Take 1 tablet (325 mg total) by mouth 2 (two) times daily. (Patient not taking: Reported on 11/21/2019) 30 tablet 0   . fluticasone (FLONASE) 50 MCG/ACT nasal spray Place 1 spray into both nostrils every evening.  (Patient not taking: Reported on 11/21/2019)     . LYSINE PO Take 1,000 mg by mouth every evening. (Patient not taking: Reported on 11/21/2019)     . methocarbamol (ROBAXIN) 500 MG tablet Take 1-2 tablets (500-1,000 mg total) by mouth every 6 (six) hours as needed for muscle spasms. (Patient not taking: Reported on 11/21/2019) 60 tablet 0   .  oxyCODONE (OXY IR/ROXICODONE) 5 MG immediate release tablet Take 1-2 tablets (5-10 mg total) by mouth every 6 (six) hours as needed for moderate pain (pain score 4-6). (Patient not taking: Reported on 11/21/2019) 50 tablet 0     ALLERGIES:  No Known Allergies  REVIEW OF SYSTEMS:  A comprehensive review of systems was negative except for: Musculoskeletal: positive for arthralgias and bone pain   FAMILY HISTORY:  History reviewed. No pertinent family history.  SOCIAL HISTORY:   Social History   Tobacco Use  . Smoking status: Former Smoker    Packs/day: 1.00    Years: 20.00    Pack years: 20.00    Types: Cigarettes    Quit date: 1985    Years since quitting: 36.7  . Smokeless tobacco: Former Network engineer Use Topics  .  Alcohol use: No     EXAMINATION:  Vital signs in last 24 hours: Temp:  [98.4 F (36.9 C)] 98.4 F (36.9 C) (09/20 0616) Pulse Rate:  [62] 62 (09/20 0616) Resp:  [15] 15 (09/20 0616) BP: (122)/(80) 122/80 (09/20 0616) SpO2:  [98 %] 98 % (09/20 0616) Weight:  [122.9 kg] 122.9 kg (09/20 0555)  BP 122/80   Pulse 62   Temp 98.4 F (36.9 C) (Oral)   Resp 15   Ht 5\' 9"  (1.753 m)   Wt 122.9 kg   SpO2 98%   BMI 40.02 kg/m   General Appearance:    Alert, cooperative, no distress, appears stated age  Head:    Normocephalic, without obvious abnormality, atraumatic  Eyes:    PERRL, conjunctiva/corneas clear, EOM's intact, fundi    benign, both eyes       Ears:    Normal TM's and external ear canals, both ears  Nose:   Nares normal, septum midline, mucosa normal, no drainage    or sinus tenderness  Throat:   Lips, mucosa, and tongue normal; teeth and gums normal  Neck:   Supple, symmetrical, trachea midline, no adenopathy;       thyroid:  No enlargement/tenderness/nodules; no carotid   bruit or JVD  Back:     Symmetric, no curvature, ROM normal, no CVA tenderness  Lungs:     Clear to auscultation bilaterally, respirations unlabored  Chest wall:    No tenderness or deformity  Heart:    Regular rate and rhythm, S1 and S2 normal, no murmur, rub   or gallop  Abdomen:     Soft, non-tender, bowel sounds active all four quadrants,    no masses, no organomegaly  Genitalia:    Normal male without lesion, discharge or tenderness  Rectal:    Normal tone, normal prostate, no masses or tenderness;   guaiac negative stool  Extremities:   Extremities normal, atraumatic, no cyanosis or edema  Pulses:   2+ and symmetric all extremities  Skin:   Skin color, texture, turgor normal, no rashes or lesions  Lymph nodes:   Cervical, supraclavicular, and axillary nodes normal  Neurologic:   CNII-XII intact. Normal strength, sensation and reflexes      throughout    Musculoskeletal:  ROM 0-120,  Ligaments intact,  Imaging Review Plain radiographs demonstrate loosening and failed tka of the left knee. The overall alignment is neutral. The bone quality appears to be good for age and reported activity level.  Assessment/Plan: Failed tka left knee  The patient history, physical examination and imaging studies are consistent with loosening/failed tka left knee. The patient has failed conservative treatment.  The  clearance notes were reviewed.  After discussion with the patient it was felt that Total Knee Revision was indicated. The procedure,  risks, and benefits of total knee revision were presented and reviewed. The risks including but not limited to aseptic loosening, infection, blood clots, vascular injury, stiffness, patella tracking problems complications among others were discussed. The patient acknowledged the explanation, agreed to proceed with the plan.  Preoperative templating of the joint replacement has been completed, documented, and submitted to the Operating Room personnel in order to optimize intra-operative equipment management.    Patient's anticipated LOS is less than 2 midnights, meeting these requirements: - Lives within 1 hour of care - Has a competent adult at home to recover with post-op recover - NO history of  - Chronic pain requiring opiods  - Diabetes  - Coronary Artery Disease  - Heart failure  - Heart attack  - Stroke  - DVT/VTE  - Cardiac arrhythmia  - Respiratory Failure/COPD  - Renal failure  - Anemia  - Advanced Liver disease       Donia Ast 01/05/2020, 7:12 AM

## 2020-01-05 NOTE — Op Note (Signed)
TOTAL KNEE REPLACEMENT OPERATIVE NOTE:  01/05/2020  10:27 AM  PATIENT:  Jesse Ali  69 y.o. male  PRE-OPERATIVE DIAGNOSIS:  Failed Left Total Knee  POST-OPERATIVE DIAGNOSIS:  Failed Left Total Knee  PROCEDURE:  Procedure(s): TOTAL KNEE REVISION  SURGEON:  Surgeon(s): Vickey Huger, MD  PHYSICIAN ASSISTANT: Carlyon Shadow, PA-C  ANESTHESIA:   spinal  SPECIMEN: None  COUNTS:  Correct  TOURNIQUET:   Total Tourniquet Time Documented: Thigh (Left) - 110 minutes Total: Thigh (Left) - 110 minutes   DICTATION:  Indication for procedure:    The patient is a 69 y.o. male who has failed conservative treatment for Failed Left Total Knee.  Informed consent was obtained prior to anesthesia. The risks versus benefits of the operation were explain and in a way the patient can, and did, understand.     Description of procedure:     The patient was taken to the operating room and placed under anesthesia. The patient was positioned in the usual fashion taking care that all body parts were adequately padded and/or protected. A tourniquet was applied and the leg prepped and draped in the usual sterile fashion. The extremity was exsanguinated with the esmarch and tourniquet inflated to 300 mmHg. Pre-operative range of motion was -5to 110 degrees.  A midline incision approximately 6-7 inches long was made with a #10 blade. A new blade was used to make a parapatellar arthrotomy going 2-3 cm into the quadriceps tendon, over the patella, and alongside the medial aspect of the patellar tendon. A synovectomy was then performed with the #10 blade and forceps. I then elevated the deep MCL off the medial tibial metaphysis subperiosteally around to the semimembranosus attachment.    I everted the patella anddebrided it and foundno problem with the prior replacement.  A homan retractor was place to retract and protect the patella and lateral structures. A Z-retractor was  placedmedially to protect the medial structures.  The femoral component was removed using a small malletmicro-e saw, revision osteotomes and slap hammerwith no issues. I then used a large rongeur and curette to remove cement and debride down to bone. There was mildbone loss on the medial  distal femur.I used a starter reamer on power to ream the femoral canal. I then reamed by hand up to a 69mm by 183mm length stem and it fit well with a +68mm medial offset. A size 10femur with22mm posterior and 55mm distal augments fit well.  The tibia was delivered forward in deep flexion and external rotation.I removed the tibial component with a small mallet and bone tamp easily as it was globally loose. Cement was removed with a quarter inch osteotome and a harrison rongeur.I used a starter reamer on power to ream the tibial canal. I then reamed by hand up to a 24mm by 128mm length stem and it fit well.A sizeEtray was selected and pinned into place centered on the medial 1/3 of the tibial tubercle. The reamer and keel were used to prepare the tibia through the tray.No Augments were needed.  A lamina spreader was placed in 90 degrees of flexion. A78mm spacer blocked was found to offer good flexion and extension gap balance after minimal in degreereleasing.    I then trialed with the size22femur, sizeEtibia, a79mm insert. I had excellent flexion/extension gap balance, excellent patella tracking. Flexion was full and beyond 100 degrees; extension was zero. These components were chosen and the staff opened them to me on the back table while the knee was lavaged copiously and  the cement mixed.   The soft tissue was infiltrated with 60cc of exparel 1.3% through a 21 gauge needle.  I cemented in the components and removed all excess cement. The polyethylene tibial component was snapped into place and the knee placed in extension while cement was hardening. The capsule was infilltrated  with a 60cc exparel/marcaine/saline mixture. Once the cement was hard, the tourniquet was let down. Hemostasis was obtained. The arthrotomy was closed using a #1 stratofix running suture. The deep soft tissues were closed with #0 vicryls and the subcuticular layer closed with #2-0 vicryl. The skin was reapproximated and closed with 3.0 Monocryl. The wound was covered with steristrips, aquacel dressing, and a TED stocking. The patient was then awakened, extubated, and taken to the recovery room in stable condition.  BLOOD LOSS: 051GZ COMPLICATIONS: None.  PLAN OF CARE:Admit for overnight observation  PATIENT DISPOSITION:PACU - hemodynamically stable.  Delay start of Pharmacological VTE agent (>24hrs) due to surgical blood loss or risk of bleeding:yes  Please fax a copy of this op note to my office at [phone] (please only include page 1 and 2 of the Case Information op note)

## 2020-01-05 NOTE — Evaluation (Signed)
Physical Therapy Evaluation Patient Details Name: Jesse Ali MRN: [id number] DOB: Dec 02, 1950 Today's Date: 01/05/2020   History of Present Illness  Patient is 69 y.o. male s/p Lt TKR on 01/05/20 with PMH significant for CKD, HTN, HLD, OA, Lt TKA in 2017, Rt TKA In 2019.    Clinical Impression  Jesse Ali is a 69 y.o. male POD 0 s/p Lt TKR. Patient reports independence with mobility at baseline. Patient is now limited by functional impairments (see PT problem list below) and requires supervision/min guard for transfers and gait with RW. Patient was able to ambulate 140 feet with RW and min guard and cues for safe walker management. Patient educated on safe sequencing for stair mobility and verbalized safe guarding position for people assisting with mobility. Patient instructed in exercises to facilitate ROM and circulation. Patient will benefit from continued skilled PT interventions to address impairments and progress towards PLOF. Patient has met mobility goals at adequate level for discharge home; will continue to follow if pt continues acute stay to progress towards Mod I goals.     Follow Up Recommendations Follow surgeons recommendation for DC plan and follow-up therapies;Outpatient PT    Equipment Recommendations  None recommended by PT    Recommendations for Other Services       Precautions / Restrictions Precautions Precautions: Fall Restrictions Weight Bearing Restrictions: No Other Position/Activity Restrictions: WBAT      Mobility  Bed Mobility Overal bed mobility: Needs Assistance Bed Mobility: Supine to Sit;Sit to Supine     Supine to sit: Supervision Sit to supine: Supervision   General bed mobility comments: pt required some increased time.  Transfers Overall transfer level: Needs assistance Equipment used: Rolling walker (2 wheeled) Transfers: Sit to/from Stand Sit to Stand: Supervision;Min guard         General transfer comment: cues for safe hand  placement/technique with RW, no assist required for power up.  Ambulation/Gait Ambulation/Gait assistance: Min guard;Supervision;Min assist Gait Distance (Feet): 140 Feet Assistive device: Rolling walker (2 wheeled) Gait Pattern/deviations: Step-to pattern;Decreased stride length;Decreased stance time - left;Trunk flexed Gait velocity: decr   General Gait Details: pt required cues throughout for posture and min assist initially to manage walker safely. Cues for safe step pattern and proximity to walker required; pt improved throughout.   Stairs Stairs: Yes Stairs assistance: Min guard Stair Management: No rails;Step to pattern;Backwards;With walker Number of Stairs: 6 (2x3) General stair comments: cues for safe step patter "up with good, down with bad" no overt LOB noted. pt performed second time with reduced cuing for safe sequencing of walker and review of guarding/assist from family.  Wheelchair Mobility    Modified Rankin (Stroke Patients Only)       Balance Overall balance assessment: Needs assistance Sitting-balance support: Feet supported Sitting balance-Leahy Scale: Good     Standing balance support: During functional activity;Bilateral upper extremity supported Standing balance-Leahy Scale: Fair                               Pertinent Vitals/Pain Pain Assessment: Faces Faces Pain Scale: Hurts a little bit Pain Location: Lt knee Pain Descriptors / Indicators: Discomfort Pain Intervention(s): Limited activity within patient's tolerance;Monitored during session;Repositioned    Home Living Family/patient expects to be discharged to:: Private residence Living Arrangements: Spouse/significant other Available Help at Discharge: Family Type of Home: House Home Access: Stairs to enter Entrance Stairs-Rails: None Entrance Stairs-Number of Steps: 3 Home Layout: One level Home  Equipment: Gilford Rile - 2 wheels;Cane - single point      Prior Function Level of  Independence: Independent               Hand Dominance   Dominant Hand: Right    Extremity/Trunk Assessment   Upper Extremity Assessment Upper Extremity Assessment: Overall WFL for tasks assessed    Lower Extremity Assessment Lower Extremity Assessment: LLE deficits/detail LLE Deficits / Details: good quad activation, no extensor lag with SLR LLE Sensation: WNL LLE Coordination: WNL    Cervical / Trunk Assessment Cervical / Trunk Assessment: Kyphotic  Communication   Communication: No difficulties  Cognition Arousal/Alertness: Awake/alert Behavior During Therapy: WFL for tasks assessed/performed Overall Cognitive Status: Within Functional Limits for tasks assessed                                        General Comments      Exercises Total Joint Exercises Ankle Circles/Pumps: AROM;Both;10 reps;Supine Quad Sets: AROM;Left;Other reps (comment);Supine (3) Short Arc Quad: AROM;Left;Other reps (comment);Supine (2) Heel Slides: AROM;Left;Other reps (comment);Supine (3) Hip ABduction/ADduction: AROM;Left;Other reps (comment);Supine (2) Straight Leg Raises: AROM;Left;Other reps (comment);Supine (2) Long Arc Quad: AROM;Left;Other reps (comment);Seated (2) Knee Flexion: AROM;Left;Other reps (comment);Seated;AAROM (2)   Assessment/Plan    PT Assessment Patient needs continued PT services  PT Problem List Decreased strength;Decreased range of motion;Decreased balance;Decreased activity tolerance;Decreased mobility;Decreased knowledge of use of DME       PT Treatment Interventions DME instruction;Gait training;Stair training;Therapeutic activities;Functional mobility training;Balance training;Therapeutic exercise;Patient/family education    PT Goals (Current goals can be found in the Care Plan section)  Acute Rehab PT Goals Patient Stated Goal: get home today PT Goal Formulation: With patient Time For Goal Achievement: 01/12/20 Potential to Achieve  Goals: Good    Frequency 7X/week   Barriers to discharge        Co-evaluation               AM-PAC PT "6 Clicks" Mobility  Outcome Measure Help needed turning from your back to your side while in a flat bed without using bedrails?: None Help needed moving from lying on your back to sitting on the side of a flat bed without using bedrails?: None Help needed moving to and from a bed to a chair (including a wheelchair)?: A Little Help needed standing up from a chair using your arms (e.g., wheelchair or bedside chair)?: A Little Help needed to walk in hospital room?: A Little Help needed climbing 3-5 steps with a railing? : A Little 6 Click Score: 20    End of Session Equipment Utilized During Treatment: Gait belt Activity Tolerance: Patient tolerated treatment well Patient left: in bed;with call bell/phone within reach Nurse Communication: Mobility status PT Visit Diagnosis: Muscle weakness (generalized) (M62.81);Difficulty in walking, not elsewhere classified (R26.2)    Time: 1300-1336 PT Time Calculation (min) (ACUTE ONLY): 36 min   Charges:   PT Evaluation $PT Eval Low Complexity: 1 Low PT Treatments $Gait Training: 8-22 mins       Verner Mould, DPT Acute Rehabilitation Services  Office [phone] Pager [phone]  01/05/2020 2:34 PM

## 2020-01-05 NOTE — Anesthesia Procedure Notes (Signed)
Anesthesia Regional Block: Adductor canal block   Pre-Anesthetic Checklist: ,, timeout performed, Correct Patient, Correct Site, Correct Laterality, Correct Procedure, Correct Position, site marked, Risks and benefits discussed,  Surgical consent,  Pre-op evaluation,  At surgeon's request and post-op pain management  Laterality: Left  Prep: chloraprep       Needles:  Injection technique: Single-shot  Needle Type: Echogenic Needle     Needle Length: 10cm  Needle Gauge: 21     Additional Needles:   Narrative:  Start time: 01/05/2020 6:55 AM End time: 01/05/2020 6:58 AM Injection made incrementally with aspirations every 5 mL.  Performed by: Personally  Anesthesiologist: Audry Pili, MD  Additional Notes: No pain on injection. No increased resistance to injection. Injection made in 5cc increments. Good needle visualization. Patient tolerated the procedure well.

## 2020-01-05 NOTE — Transfer of Care (Signed)
Immediate Anesthesia Transfer of Care Note  Patient: Jesse Ali  Procedure(s) Performed: Procedure(s): TOTAL KNEE REVISION (Left)  Patient Location: PACU  Anesthesia Type:Spinal  Level of Consciousness:  sedated, patient cooperative and responds to stimulation  Airway & Oxygen Therapy:Patient Spontanous Breathing and Patient connected to face mask oxgen  Post-op Assessment:  Report given to PACU RN and Post -op Vital signs reviewed and stable  Post vital signs:  Reviewed and stable  Last Vitals:  Vitals:   01/05/20 0616  BP: 122/80  Pulse: 62  Resp: 15  Temp: 36.9 C  SpO2: 13%    Complications: No apparent anesthesia complications

## 2020-01-05 NOTE — Progress Notes (Signed)
Orthopedic Tech Progress Note Patient Details:  Jesse Ali 12/29/1950 [id number] Patient refused bone foam. Patient ID: Jesse Ali, male   DOB: 19-Sep-1950, 69 y.o.   MRN: [id number]   Jesse Ali 01/05/2020, 10:52 AM

## 2020-01-09 ENCOUNTER — Encounter (HOSPITAL_COMMUNITY): Payer: Self-pay | Admitting: Orthopedic Surgery

## 2020-01-09 LAB — TYPE AND SCREEN
ABO/RH(D): A NEG
Antibody Screen: POSITIVE
Unit division: 0
Unit division: 0

## 2020-01-09 LAB — BPAM RBC
Blood Product Expiration Date: 202110102359
Blood Product Expiration Date: 202110102359
Unit Type and Rh: 600
Unit Type and Rh: 600

## 2020-01-12 DIAGNOSIS — Z96652 Presence of left artificial knee joint: Secondary | ICD-10-CM | POA: Diagnosis not present

## 2020-01-12 DIAGNOSIS — R2689 Other abnormalities of gait and mobility: Secondary | ICD-10-CM | POA: Diagnosis not present

## 2020-01-12 DIAGNOSIS — Z471 Aftercare following joint replacement surgery: Secondary | ICD-10-CM | POA: Diagnosis not present

## 2020-01-12 DIAGNOSIS — M62552 Muscle wasting and atrophy, not elsewhere classified, left thigh: Secondary | ICD-10-CM | POA: Diagnosis not present

## 2020-01-12 DIAGNOSIS — M25562 Pain in left knee: Secondary | ICD-10-CM | POA: Diagnosis not present

## 2020-01-12 DIAGNOSIS — M25462 Effusion, left knee: Secondary | ICD-10-CM | POA: Diagnosis not present

## 2020-01-15 DIAGNOSIS — M62552 Muscle wasting and atrophy, not elsewhere classified, left thigh: Secondary | ICD-10-CM | POA: Diagnosis not present

## 2020-01-15 DIAGNOSIS — R2689 Other abnormalities of gait and mobility: Secondary | ICD-10-CM | POA: Diagnosis not present

## 2020-01-15 DIAGNOSIS — Z471 Aftercare following joint replacement surgery: Secondary | ICD-10-CM | POA: Diagnosis not present

## 2020-01-15 DIAGNOSIS — M25562 Pain in left knee: Secondary | ICD-10-CM | POA: Diagnosis not present

## 2020-01-15 DIAGNOSIS — Z96652 Presence of left artificial knee joint: Secondary | ICD-10-CM | POA: Diagnosis not present

## 2020-01-15 DIAGNOSIS — M25462 Effusion, left knee: Secondary | ICD-10-CM | POA: Diagnosis not present

## 2020-01-19 DIAGNOSIS — M25562 Pain in left knee: Secondary | ICD-10-CM | POA: Diagnosis not present

## 2020-01-19 DIAGNOSIS — R2689 Other abnormalities of gait and mobility: Secondary | ICD-10-CM | POA: Diagnosis not present

## 2020-01-19 DIAGNOSIS — M62552 Muscle wasting and atrophy, not elsewhere classified, left thigh: Secondary | ICD-10-CM | POA: Diagnosis not present

## 2020-01-19 DIAGNOSIS — M25462 Effusion, left knee: Secondary | ICD-10-CM | POA: Diagnosis not present

## 2020-01-19 DIAGNOSIS — Z96652 Presence of left artificial knee joint: Secondary | ICD-10-CM | POA: Diagnosis not present

## 2020-01-19 DIAGNOSIS — Z471 Aftercare following joint replacement surgery: Secondary | ICD-10-CM | POA: Diagnosis not present

## 2020-01-22 DIAGNOSIS — M25562 Pain in left knee: Secondary | ICD-10-CM | POA: Diagnosis not present

## 2020-01-22 DIAGNOSIS — M62552 Muscle wasting and atrophy, not elsewhere classified, left thigh: Secondary | ICD-10-CM | POA: Diagnosis not present

## 2020-01-22 DIAGNOSIS — Z96652 Presence of left artificial knee joint: Secondary | ICD-10-CM | POA: Diagnosis not present

## 2020-01-22 DIAGNOSIS — R2689 Other abnormalities of gait and mobility: Secondary | ICD-10-CM | POA: Diagnosis not present

## 2020-01-22 DIAGNOSIS — Z471 Aftercare following joint replacement surgery: Secondary | ICD-10-CM | POA: Diagnosis not present

## 2020-01-22 DIAGNOSIS — M25462 Effusion, left knee: Secondary | ICD-10-CM | POA: Diagnosis not present

## 2020-01-23 DIAGNOSIS — J301 Allergic rhinitis due to pollen: Secondary | ICD-10-CM | POA: Diagnosis not present

## 2020-01-23 DIAGNOSIS — G4752 REM sleep behavior disorder: Secondary | ICD-10-CM | POA: Diagnosis not present

## 2020-01-23 DIAGNOSIS — G4733 Obstructive sleep apnea (adult) (pediatric): Secondary | ICD-10-CM | POA: Diagnosis not present

## 2020-01-26 DIAGNOSIS — M25462 Effusion, left knee: Secondary | ICD-10-CM | POA: Diagnosis not present

## 2020-01-26 DIAGNOSIS — R2689 Other abnormalities of gait and mobility: Secondary | ICD-10-CM | POA: Diagnosis not present

## 2020-01-26 DIAGNOSIS — Z471 Aftercare following joint replacement surgery: Secondary | ICD-10-CM | POA: Diagnosis not present

## 2020-01-26 DIAGNOSIS — M25562 Pain in left knee: Secondary | ICD-10-CM | POA: Diagnosis not present

## 2020-01-26 DIAGNOSIS — Z96652 Presence of left artificial knee joint: Secondary | ICD-10-CM | POA: Diagnosis not present

## 2020-01-26 DIAGNOSIS — M62552 Muscle wasting and atrophy, not elsewhere classified, left thigh: Secondary | ICD-10-CM | POA: Diagnosis not present

## 2020-01-29 DIAGNOSIS — M25462 Effusion, left knee: Secondary | ICD-10-CM | POA: Diagnosis not present

## 2020-01-29 DIAGNOSIS — R2689 Other abnormalities of gait and mobility: Secondary | ICD-10-CM | POA: Diagnosis not present

## 2020-01-29 DIAGNOSIS — Z96652 Presence of left artificial knee joint: Secondary | ICD-10-CM | POA: Diagnosis not present

## 2020-01-29 DIAGNOSIS — M62552 Muscle wasting and atrophy, not elsewhere classified, left thigh: Secondary | ICD-10-CM | POA: Diagnosis not present

## 2020-01-29 DIAGNOSIS — Z471 Aftercare following joint replacement surgery: Secondary | ICD-10-CM | POA: Diagnosis not present

## 2020-01-29 DIAGNOSIS — M25562 Pain in left knee: Secondary | ICD-10-CM | POA: Diagnosis not present

## 2020-02-19 DIAGNOSIS — G4733 Obstructive sleep apnea (adult) (pediatric): Secondary | ICD-10-CM | POA: Diagnosis not present

## 2020-03-15 DIAGNOSIS — G4733 Obstructive sleep apnea (adult) (pediatric): Secondary | ICD-10-CM | POA: Diagnosis not present

## 2020-03-29 DIAGNOSIS — G4733 Obstructive sleep apnea (adult) (pediatric): Secondary | ICD-10-CM | POA: Diagnosis not present

## 2020-06-07 DIAGNOSIS — G4733 Obstructive sleep apnea (adult) (pediatric): Secondary | ICD-10-CM | POA: Diagnosis not present

## 2020-06-30 ENCOUNTER — Encounter: Payer: Self-pay | Admitting: Gastroenterology

## 2020-07-09 DIAGNOSIS — N183 Chronic kidney disease, stage 3 unspecified: Secondary | ICD-10-CM | POA: Diagnosis not present

## 2020-07-09 DIAGNOSIS — Z6839 Body mass index (BMI) 39.0-39.9, adult: Secondary | ICD-10-CM | POA: Diagnosis not present

## 2020-07-09 DIAGNOSIS — Z125 Encounter for screening for malignant neoplasm of prostate: Secondary | ICD-10-CM | POA: Diagnosis not present

## 2020-07-09 DIAGNOSIS — Z79899 Other long term (current) drug therapy: Secondary | ICD-10-CM | POA: Diagnosis not present

## 2020-07-09 DIAGNOSIS — E785 Hyperlipidemia, unspecified: Secondary | ICD-10-CM | POA: Diagnosis not present

## 2020-07-09 DIAGNOSIS — I10 Essential (primary) hypertension: Secondary | ICD-10-CM | POA: Diagnosis not present

## 2020-07-09 DIAGNOSIS — G2581 Restless legs syndrome: Secondary | ICD-10-CM | POA: Diagnosis not present

## 2020-07-19 DIAGNOSIS — H9319 Tinnitus, unspecified ear: Secondary | ICD-10-CM | POA: Diagnosis not present

## 2020-07-19 DIAGNOSIS — Z77122 Contact with and (suspected) exposure to noise: Secondary | ICD-10-CM | POA: Diagnosis not present

## 2020-07-19 DIAGNOSIS — J342 Deviated nasal septum: Secondary | ICD-10-CM | POA: Diagnosis not present

## 2020-07-19 DIAGNOSIS — H6121 Impacted cerumen, right ear: Secondary | ICD-10-CM | POA: Diagnosis not present

## 2020-07-19 DIAGNOSIS — H9190 Unspecified hearing loss, unspecified ear: Secondary | ICD-10-CM | POA: Diagnosis not present

## 2020-07-20 DIAGNOSIS — G4733 Obstructive sleep apnea (adult) (pediatric): Secondary | ICD-10-CM | POA: Diagnosis not present

## 2020-07-20 DIAGNOSIS — G4752 REM sleep behavior disorder: Secondary | ICD-10-CM | POA: Diagnosis not present

## 2020-07-20 DIAGNOSIS — J301 Allergic rhinitis due to pollen: Secondary | ICD-10-CM | POA: Diagnosis not present

## 2020-08-14 DIAGNOSIS — E785 Hyperlipidemia, unspecified: Secondary | ICD-10-CM | POA: Diagnosis not present

## 2020-08-14 DIAGNOSIS — J309 Allergic rhinitis, unspecified: Secondary | ICD-10-CM | POA: Diagnosis not present

## 2020-08-14 DIAGNOSIS — I10 Essential (primary) hypertension: Secondary | ICD-10-CM | POA: Diagnosis not present

## 2020-09-13 DIAGNOSIS — G4733 Obstructive sleep apnea (adult) (pediatric): Secondary | ICD-10-CM | POA: Diagnosis not present

## 2020-09-13 DIAGNOSIS — E785 Hyperlipidemia, unspecified: Secondary | ICD-10-CM | POA: Diagnosis not present

## 2020-09-13 DIAGNOSIS — J309 Allergic rhinitis, unspecified: Secondary | ICD-10-CM | POA: Diagnosis not present

## 2020-09-13 DIAGNOSIS — I10 Essential (primary) hypertension: Secondary | ICD-10-CM | POA: Diagnosis not present

## 2020-09-30 DIAGNOSIS — N183 Chronic kidney disease, stage 3 unspecified: Secondary | ICD-10-CM | POA: Diagnosis not present

## 2020-09-30 DIAGNOSIS — D649 Anemia, unspecified: Secondary | ICD-10-CM | POA: Diagnosis not present

## 2020-09-30 DIAGNOSIS — E785 Hyperlipidemia, unspecified: Secondary | ICD-10-CM | POA: Diagnosis not present

## 2020-09-30 DIAGNOSIS — Z6838 Body mass index (BMI) 38.0-38.9, adult: Secondary | ICD-10-CM | POA: Diagnosis not present

## 2020-09-30 DIAGNOSIS — Z79899 Other long term (current) drug therapy: Secondary | ICD-10-CM | POA: Diagnosis not present

## 2020-09-30 DIAGNOSIS — W57XXXA Bitten or stung by nonvenomous insect and other nonvenomous arthropods, initial encounter: Secondary | ICD-10-CM | POA: Diagnosis not present

## 2020-10-05 DIAGNOSIS — M25562 Pain in left knee: Secondary | ICD-10-CM | POA: Diagnosis not present

## 2020-10-05 DIAGNOSIS — Z96652 Presence of left artificial knee joint: Secondary | ICD-10-CM | POA: Diagnosis not present

## 2020-10-05 DIAGNOSIS — D649 Anemia, unspecified: Secondary | ICD-10-CM | POA: Diagnosis not present

## 2020-11-14 DIAGNOSIS — J309 Allergic rhinitis, unspecified: Secondary | ICD-10-CM | POA: Diagnosis not present

## 2020-11-14 DIAGNOSIS — E785 Hyperlipidemia, unspecified: Secondary | ICD-10-CM | POA: Diagnosis not present

## 2020-11-14 DIAGNOSIS — I10 Essential (primary) hypertension: Secondary | ICD-10-CM | POA: Diagnosis not present

## 2020-11-18 DIAGNOSIS — H9319 Tinnitus, unspecified ear: Secondary | ICD-10-CM | POA: Diagnosis not present

## 2020-11-18 DIAGNOSIS — H903 Sensorineural hearing loss, bilateral: Secondary | ICD-10-CM | POA: Diagnosis not present

## 2020-11-18 DIAGNOSIS — Z77122 Contact with and (suspected) exposure to noise: Secondary | ICD-10-CM | POA: Diagnosis not present

## 2021-01-14 DIAGNOSIS — J309 Allergic rhinitis, unspecified: Secondary | ICD-10-CM | POA: Diagnosis not present

## 2021-01-14 DIAGNOSIS — E785 Hyperlipidemia, unspecified: Secondary | ICD-10-CM | POA: Diagnosis not present

## 2021-01-14 DIAGNOSIS — I10 Essential (primary) hypertension: Secondary | ICD-10-CM | POA: Diagnosis not present

## 2021-02-22 DIAGNOSIS — G4733 Obstructive sleep apnea (adult) (pediatric): Secondary | ICD-10-CM | POA: Diagnosis not present

## 2021-02-23 DIAGNOSIS — Z23 Encounter for immunization: Secondary | ICD-10-CM | POA: Diagnosis not present

## 2021-03-23 DIAGNOSIS — Z86010 Personal history of colonic polyps: Secondary | ICD-10-CM | POA: Diagnosis not present

## 2021-03-23 DIAGNOSIS — K5730 Diverticulosis of large intestine without perforation or abscess without bleeding: Secondary | ICD-10-CM | POA: Diagnosis not present

## 2021-03-23 DIAGNOSIS — Z1211 Encounter for screening for malignant neoplasm of colon: Secondary | ICD-10-CM | POA: Diagnosis not present

## 2021-03-24 DIAGNOSIS — G4733 Obstructive sleep apnea (adult) (pediatric): Secondary | ICD-10-CM | POA: Diagnosis not present

## 2021-06-10 DIAGNOSIS — G4733 Obstructive sleep apnea (adult) (pediatric): Secondary | ICD-10-CM | POA: Diagnosis not present

## 2021-09-22 DIAGNOSIS — Z0000 Encounter for general adult medical examination without abnormal findings: Secondary | ICD-10-CM | POA: Diagnosis not present

## 2021-09-22 DIAGNOSIS — I10 Essential (primary) hypertension: Secondary | ICD-10-CM | POA: Diagnosis not present

## 2021-09-22 DIAGNOSIS — Z79899 Other long term (current) drug therapy: Secondary | ICD-10-CM | POA: Diagnosis not present

## 2021-09-22 DIAGNOSIS — Z6837 Body mass index (BMI) 37.0-37.9, adult: Secondary | ICD-10-CM | POA: Diagnosis not present

## 2021-09-22 DIAGNOSIS — Z125 Encounter for screening for malignant neoplasm of prostate: Secondary | ICD-10-CM | POA: Diagnosis not present

## 2021-09-22 DIAGNOSIS — E785 Hyperlipidemia, unspecified: Secondary | ICD-10-CM | POA: Diagnosis not present

## 2021-09-22 DIAGNOSIS — G2581 Restless legs syndrome: Secondary | ICD-10-CM | POA: Diagnosis not present

## 2021-09-22 DIAGNOSIS — N183 Chronic kidney disease, stage 3 unspecified: Secondary | ICD-10-CM | POA: Diagnosis not present

## 2021-09-22 DIAGNOSIS — Z1331 Encounter for screening for depression: Secondary | ICD-10-CM | POA: Diagnosis not present

## 2021-09-27 DIAGNOSIS — G4733 Obstructive sleep apnea (adult) (pediatric): Secondary | ICD-10-CM | POA: Diagnosis not present

## 2021-11-10 DIAGNOSIS — G4733 Obstructive sleep apnea (adult) (pediatric): Secondary | ICD-10-CM | POA: Diagnosis not present

## 2021-12-10 DIAGNOSIS — G4733 Obstructive sleep apnea (adult) (pediatric): Secondary | ICD-10-CM | POA: Diagnosis not present

## 2022-01-03 DIAGNOSIS — Z6838 Body mass index (BMI) 38.0-38.9, adult: Secondary | ICD-10-CM | POA: Diagnosis not present

## 2022-01-03 DIAGNOSIS — M7071 Other bursitis of hip, right hip: Secondary | ICD-10-CM | POA: Diagnosis not present

## 2022-01-09 DIAGNOSIS — G4733 Obstructive sleep apnea (adult) (pediatric): Secondary | ICD-10-CM | POA: Diagnosis not present

## 2022-01-19 DIAGNOSIS — M7061 Trochanteric bursitis, right hip: Secondary | ICD-10-CM | POA: Diagnosis not present

## 2022-02-16 DIAGNOSIS — S46112A Strain of muscle, fascia and tendon of long head of biceps, left arm, initial encounter: Secondary | ICD-10-CM | POA: Diagnosis not present

## 2022-03-14 DIAGNOSIS — J189 Pneumonia, unspecified organism: Secondary | ICD-10-CM | POA: Diagnosis not present

## 2022-03-27 DIAGNOSIS — G4733 Obstructive sleep apnea (adult) (pediatric): Secondary | ICD-10-CM | POA: Diagnosis not present

## 2022-05-24 DIAGNOSIS — G4733 Obstructive sleep apnea (adult) (pediatric): Secondary | ICD-10-CM | POA: Diagnosis not present

## 2022-07-12 DIAGNOSIS — G4733 Obstructive sleep apnea (adult) (pediatric): Secondary | ICD-10-CM | POA: Diagnosis not present

## 2022-08-22 DIAGNOSIS — G4733 Obstructive sleep apnea (adult) (pediatric): Secondary | ICD-10-CM | POA: Diagnosis not present

## 2022-09-22 DIAGNOSIS — E785 Hyperlipidemia, unspecified: Secondary | ICD-10-CM | POA: Diagnosis not present

## 2022-09-22 DIAGNOSIS — Z125 Encounter for screening for malignant neoplasm of prostate: Secondary | ICD-10-CM | POA: Diagnosis not present

## 2022-09-22 DIAGNOSIS — Z79899 Other long term (current) drug therapy: Secondary | ICD-10-CM | POA: Diagnosis not present

## 2022-09-25 DIAGNOSIS — Z6838 Body mass index (BMI) 38.0-38.9, adult: Secondary | ICD-10-CM | POA: Diagnosis not present

## 2022-09-25 DIAGNOSIS — G2581 Restless legs syndrome: Secondary | ICD-10-CM | POA: Diagnosis not present

## 2022-09-25 DIAGNOSIS — E785 Hyperlipidemia, unspecified: Secondary | ICD-10-CM | POA: Diagnosis not present

## 2022-09-25 DIAGNOSIS — Z1339 Encounter for screening examination for other mental health and behavioral disorders: Secondary | ICD-10-CM | POA: Diagnosis not present

## 2022-09-25 DIAGNOSIS — Z1331 Encounter for screening for depression: Secondary | ICD-10-CM | POA: Diagnosis not present

## 2022-09-25 DIAGNOSIS — Z0000 Encounter for general adult medical examination without abnormal findings: Secondary | ICD-10-CM | POA: Diagnosis not present

## 2022-09-25 DIAGNOSIS — N183 Chronic kidney disease, stage 3 unspecified: Secondary | ICD-10-CM | POA: Diagnosis not present

## 2022-09-25 DIAGNOSIS — I10 Essential (primary) hypertension: Secondary | ICD-10-CM | POA: Diagnosis not present

## 2022-09-26 DIAGNOSIS — G4733 Obstructive sleep apnea (adult) (pediatric): Secondary | ICD-10-CM | POA: Diagnosis not present

## 2022-12-15 DIAGNOSIS — G4733 Obstructive sleep apnea (adult) (pediatric): Secondary | ICD-10-CM | POA: Diagnosis not present

## 2023-02-19 DIAGNOSIS — G4733 Obstructive sleep apnea (adult) (pediatric): Secondary | ICD-10-CM | POA: Diagnosis not present

## 2023-03-23 DIAGNOSIS — I739 Peripheral vascular disease, unspecified: Secondary | ICD-10-CM | POA: Diagnosis not present

## 2023-03-23 DIAGNOSIS — Z6836 Body mass index (BMI) 36.0-36.9, adult: Secondary | ICD-10-CM | POA: Diagnosis not present

## 2023-03-26 DIAGNOSIS — G4733 Obstructive sleep apnea (adult) (pediatric): Secondary | ICD-10-CM | POA: Diagnosis not present

## 2023-04-23 ENCOUNTER — Other Ambulatory Visit: Payer: Self-pay

## 2023-04-23 DIAGNOSIS — I70218 Atherosclerosis of native arteries of extremities with intermittent claudication, other extremity: Secondary | ICD-10-CM

## 2023-05-02 NOTE — Progress Notes (Signed)
Patient ID: Warnell Mizer, male   DOB: 1950/09/09, 73 y.o.   MRN: [id number]  Reason for Consult: New Patient (Initial Visit)   Referred by Marylen Ponto, MD  Subjective:     HPI  Irah Bonenfant is a 73 y.o. male presenting for evaluation of bilateral arm pain.  He reports pins-and-needles sensation and starts to go numb if he holds his arm down in a dependent position.  He also reports he started to have subjective coldness in the right hand.  He denies hand or finger pain.  He denies any wounds on the hand.   He is a former smoker and quit in 1985  Past Medical History:  Diagnosis Date   Arthritis    fingers   Cancer (HCC)    skin Ca,on right eye   CKD (chronic kidney disease), stage III (HCC)    patient denies   Hyperlipidemia    Hypertension    Sleep apnea    wears CPAP  does not know settins    Wears glasses    Wears glasses    History reviewed. No pertinent family history. 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   Procedure: LEFT TOTAL KNEE ARTHROPLASTY;  Surgeon: Eldred Manges, MD;  Location: [facility] OR;  Service: Orthopedics;  Laterality: Left;   TOTAL KNEE ARTHROPLASTY Right 10/29/2017   Procedure: RIGHT TOTAL KNEE ARTHROPLASTY;  Surgeon: Dannielle Huh, MD;  Location: [facility] OR;  Service: Orthopedics;  Laterality: Right;   TOTAL KNEE REVISION Left 01/05/2020   Procedure: TOTAL KNEE REVISION;  Surgeon: Dannielle Huh, MD;  Location: [facility];  Service: Orthopedics;  Laterality: Left;    Short Social History:  Social History   Tobacco Use   Smoking status: Former    Current packs/day: 0.00    Average packs/day: 1 pack/day for 20.0 years (20.0 ttl pk-yrs)    Types: Cigarettes    Start date: 21    Quit date: 1985    Years since quitting: 40.0    Smokeless tobacco: Former  Substance Use Topics   Alcohol use: No    No Known Allergies  Current Outpatient Medications  Medication Sig Dispense Refill   amLODipine (NORVASC) 5 MG tablet Take 5 mg by mouth every evening.      atorvastatin (LIPITOR) 10 MG tablet Take 10 mg by mouth every evening.      azelastine (ASTELIN) 0.1 % nasal spray Place 1 spray into both nostrils daily as needed for allergies. Use in each nostril as directed      gabapentin (NEURONTIN) 300 MG capsule Take 300 mg by mouth every evening.      losartan (COZAAR) 100 MG tablet Take 100 mg by mouth every evening.     methocarbamol (ROBAXIN) 500 MG tablet Take 1-2 tablets (500-1,000 mg total) by mouth 4 (four) times daily. 60 tablet 0   oxyCODONE (OXY IR/ROXICODONE) 5 MG immediate release tablet Take 1-2 tablets (5-10 mg total) by mouth every 6 (six) hours as needed for severe pain. 50 tablet 0   No current facility-administered medications for this visit.    REVIEW OF SYSTEMS   All other systems were reviewed and are negative     Objective:  Objective  Vitals:   05/04/23 0941  BP: 113/76  Pulse: 65  Resp: 20  Temp: 98.3 F (36.8 C)  SpO2: 96%  Weight: 250 lb (113.4 kg)  Height: 5\' 9"  (1.753 m)   Body mass index is 36.92 kg/m.  Physical Exam General: no acute distress Cardiac: hemodynamically stable, nontachycardic Pulm: normal work of breathing Neuro: alert, no focal deficit Extremities: no edema, cyanosis or wounds Vascular:   Right: Palpable radial, brachial, biphasic palmar arch  Left: Palpable radial, brachial, biphasic palmar arch   Data: Right arm duplex Right Doppler Findings:  +--------+--------+-----+---------+---------+  Site   PressureIndexDoppler  Comments   +--------+--------+-----+---------+---------+  [id number]         triphasic           +--------+--------+-----+---------+---------+  Radial 153     1.22 triphasic            +--------+--------+-----+---------+---------+  Ulnar  141     1.13                     +--------+--------+-----+---------+---------+  Digit  146     1.17          DBI: 1.17  +--------+--------+-----+---------+---------+    +---+----+   [id number]   +---+----+   Left Doppler Findings:  +--------+--------+-----+---------+---------+  Site   PressureIndexDoppler  Comments   +--------+--------+-----+---------+---------+  [id number]         triphasic           +--------+--------+-----+---------+---------+  Radial 126     1.01 triphasic           +--------+--------+-----+---------+---------+  Ulnar  132     1.06 triphasic           +--------+--------+-----+---------+---------+  Digit  144     1.15          DBI: 1.15  +--------+--------+-----+---------+---------+    +---+----+   WBI1.06   +---+----+   Note from PCP reviewed  Labs reviewed, creatinine 1.8     Assessment/Plan:     Jahquel Plese is a 73 y.o. 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  I explained that he has normal blood flow to bilateral hands.  I explained that this symptoms with pins and needle pain is most likely due to neuropathy.  I explained that this could either stem from the nerves coming from his C-spine or more likely due to his bilateral shoulder surgeries.  Follow-up as needed     Daria Pastures MD [facility]

## 2023-05-04 ENCOUNTER — Ambulatory Visit: Payer: PPO | Admitting: Vascular Surgery

## 2023-05-04 ENCOUNTER — Ambulatory Visit (HOSPITAL_COMMUNITY)
Admission: RE | Admit: 2023-05-04 | Discharge: 2023-05-04 | Disposition: A | Discharge status: Home / Self Care | Payer: PPO | Source: Ambulatory Visit | Attending: Vascular Surgery | Admitting: Vascular Surgery

## 2023-05-04 ENCOUNTER — Encounter: Payer: Self-pay | Admitting: Vascular Surgery

## 2023-05-04 VITALS — BP 113/76 | HR 65 | Temp 98.3°F | Resp 20 | Ht 69.0 in | Wt 250.0 lb

## 2023-05-04 DIAGNOSIS — I70218 Atherosclerosis of native arteries of extremities with intermittent claudication, other extremity: Secondary | ICD-10-CM | POA: Diagnosis not present

## 2023-05-04 DIAGNOSIS — G629 Polyneuropathy, unspecified: Secondary | ICD-10-CM

## 2023-06-13 DIAGNOSIS — M79643 Pain in unspecified hand: Secondary | ICD-10-CM | POA: Diagnosis not present

## 2023-06-13 DIAGNOSIS — Z6836 Body mass index (BMI) 36.0-36.9, adult: Secondary | ICD-10-CM | POA: Diagnosis not present

## 2023-06-25 DIAGNOSIS — G4733 Obstructive sleep apnea (adult) (pediatric): Secondary | ICD-10-CM | POA: Diagnosis not present

## 2023-07-03 DIAGNOSIS — M79642 Pain in left hand: Secondary | ICD-10-CM | POA: Diagnosis not present

## 2023-07-03 DIAGNOSIS — M85641 Other cyst of bone, right hand: Secondary | ICD-10-CM | POA: Diagnosis not present

## 2023-07-03 DIAGNOSIS — M65342 Trigger finger, left ring finger: Secondary | ICD-10-CM | POA: Diagnosis not present

## 2023-07-03 DIAGNOSIS — M79641 Pain in right hand: Secondary | ICD-10-CM | POA: Diagnosis not present

## 2023-07-31 DIAGNOSIS — M180 Bilateral primary osteoarthritis of first carpometacarpal joints: Secondary | ICD-10-CM | POA: Diagnosis not present

## 2023-08-08 DIAGNOSIS — G4733 Obstructive sleep apnea (adult) (pediatric): Secondary | ICD-10-CM | POA: Diagnosis not present

## 2023-09-07 DIAGNOSIS — G4733 Obstructive sleep apnea (adult) (pediatric): Secondary | ICD-10-CM | POA: Diagnosis not present

## 2023-10-08 DIAGNOSIS — G4733 Obstructive sleep apnea (adult) (pediatric): Secondary | ICD-10-CM | POA: Diagnosis not present

## 2023-10-17 DIAGNOSIS — Z79899 Other long term (current) drug therapy: Secondary | ICD-10-CM | POA: Diagnosis not present

## 2023-10-17 DIAGNOSIS — Z125 Encounter for screening for malignant neoplasm of prostate: Secondary | ICD-10-CM | POA: Diagnosis not present

## 2023-10-17 DIAGNOSIS — E785 Hyperlipidemia, unspecified: Secondary | ICD-10-CM | POA: Diagnosis not present

## 2023-10-23 DIAGNOSIS — E785 Hyperlipidemia, unspecified: Secondary | ICD-10-CM | POA: Diagnosis not present

## 2023-10-23 DIAGNOSIS — Z0000 Encounter for general adult medical examination without abnormal findings: Secondary | ICD-10-CM | POA: Diagnosis not present

## 2023-10-23 DIAGNOSIS — Z1331 Encounter for screening for depression: Secondary | ICD-10-CM | POA: Diagnosis not present

## 2023-10-23 DIAGNOSIS — I10 Essential (primary) hypertension: Secondary | ICD-10-CM | POA: Diagnosis not present

## 2023-10-23 DIAGNOSIS — Z6836 Body mass index (BMI) 36.0-36.9, adult: Secondary | ICD-10-CM | POA: Diagnosis not present

## 2023-10-23 DIAGNOSIS — Z1339 Encounter for screening examination for other mental health and behavioral disorders: Secondary | ICD-10-CM | POA: Diagnosis not present

## 2023-10-23 DIAGNOSIS — N183 Chronic kidney disease, stage 3 unspecified: Secondary | ICD-10-CM | POA: Diagnosis not present

## 2023-11-07 DIAGNOSIS — G4733 Obstructive sleep apnea (adult) (pediatric): Secondary | ICD-10-CM | POA: Diagnosis not present

## 2023-12-10 DIAGNOSIS — G4733 Obstructive sleep apnea (adult) (pediatric): Secondary | ICD-10-CM | POA: Diagnosis not present

## 2024-01-11 DIAGNOSIS — G4733 Obstructive sleep apnea (adult) (pediatric): Secondary | ICD-10-CM | POA: Diagnosis not present

## 2024-03-12 DIAGNOSIS — G4733 Obstructive sleep apnea (adult) (pediatric): Secondary | ICD-10-CM | POA: Diagnosis not present

## 2024-04-04 ENCOUNTER — Encounter: Payer: Self-pay | Admitting: Physician Assistant

## 2024-06-05 ENCOUNTER — Encounter: Payer: Self-pay | Admitting: Physician Assistant
